# Patient Record
Sex: Female | Born: 1970
Health system: Southern US, Community
[De-identification: ages and names within clinical notes are randomized; demographics above are authoritative.]

## PROBLEM LIST (undated history)

## (undated) DIAGNOSIS — F32A Depression, unspecified: Secondary | ICD-10-CM

## (undated) DIAGNOSIS — G43909 Migraine, unspecified, not intractable, without status migrainosus: Secondary | ICD-10-CM

## (undated) DIAGNOSIS — F1010 Alcohol abuse, uncomplicated: Secondary | ICD-10-CM

## (undated) DIAGNOSIS — D649 Anemia, unspecified: Secondary | ICD-10-CM

## (undated) DIAGNOSIS — R7303 Prediabetes: Secondary | ICD-10-CM

## (undated) HISTORY — DX: Anemia, unspecified: D64.9

## (undated) HISTORY — PX: ABDOMINAL HYSTERECTOMY: SHX81

## (undated) HISTORY — DX: Alcohol abuse, uncomplicated: F10.10

## (undated) HISTORY — PX: TEAR DUCT PROBING: SHX793

## (undated) HISTORY — PX: PARTIAL HYSTERECTOMY: SHX80

## (undated) HISTORY — PX: FINGER SURGERY: SHX640

## (undated) HISTORY — DX: Prediabetes: R73.03

## (undated) HISTORY — DX: Depression, unspecified: F32.A

## (undated) HISTORY — DX: Migraine, unspecified, not intractable, without status migrainosus: G43.909

## (undated) HISTORY — PX: PILONIDAL CYST EXCISION: SHX744

## (undated) HISTORY — PX: KNEE SURGERY: SHX244

---
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
## 1999-11-22 ENCOUNTER — Ambulatory Visit (HOSPITAL_COMMUNITY): Admission: RE | Admit: 1999-11-22 | Discharge: 1999-11-22 | Payer: Self-pay | Admitting: *Deleted

## 1999-11-22 ENCOUNTER — Encounter: Payer: Self-pay | Admitting: *Deleted

## 1999-12-18 ENCOUNTER — Other Ambulatory Visit: Admission: RE | Admit: 1999-12-18 | Discharge: 1999-12-18 | Payer: Self-pay | Admitting: Obstetrics and Gynecology

## 2000-03-03 HISTORY — PX: FINGER SURGERY: SHX640

## 2000-03-22 ENCOUNTER — Emergency Department (HOSPITAL_COMMUNITY): Admission: EM | Admit: 2000-03-22 | Discharge: 2000-03-22 | Payer: Self-pay

## 2000-05-26 ENCOUNTER — Inpatient Hospital Stay (HOSPITAL_COMMUNITY): Admission: AD | Admit: 2000-05-26 | Discharge: 2000-05-26 | Payer: Self-pay | Admitting: Obstetrics and Gynecology

## 2000-06-16 ENCOUNTER — Inpatient Hospital Stay (HOSPITAL_COMMUNITY): Admission: AD | Admit: 2000-06-16 | Discharge: 2000-06-16 | Payer: Self-pay | Admitting: Obstetrics and Gynecology

## 2000-06-24 ENCOUNTER — Inpatient Hospital Stay (HOSPITAL_COMMUNITY): Admission: AD | Admit: 2000-06-24 | Discharge: 2000-06-24 | Payer: Self-pay | Admitting: Obstetrics & Gynecology

## 2000-06-28 ENCOUNTER — Inpatient Hospital Stay (HOSPITAL_COMMUNITY): Admission: AD | Admit: 2000-06-28 | Discharge: 2000-06-30 | Payer: Self-pay | Admitting: Obstetrics and Gynecology

## 2000-07-01 ENCOUNTER — Encounter: Admission: RE | Admit: 2000-07-01 | Discharge: 2000-07-13 | Payer: Self-pay | Admitting: Obstetrics and Gynecology

## 2000-07-30 ENCOUNTER — Other Ambulatory Visit: Admission: RE | Admit: 2000-07-30 | Discharge: 2000-07-30 | Payer: Self-pay | Admitting: Obstetrics and Gynecology

## 2001-08-03 ENCOUNTER — Other Ambulatory Visit: Admission: RE | Admit: 2001-08-03 | Discharge: 2001-08-03 | Payer: Self-pay | Admitting: Obstetrics and Gynecology

## 2004-06-26 ENCOUNTER — Other Ambulatory Visit: Admission: RE | Admit: 2004-06-26 | Discharge: 2004-06-26 | Payer: Self-pay | Admitting: Obstetrics and Gynecology

## 2005-10-07 ENCOUNTER — Encounter: Admission: RE | Admit: 2005-10-07 | Discharge: 2005-10-07 | Payer: Self-pay | Admitting: Obstetrics and Gynecology

## 2005-10-15 ENCOUNTER — Encounter: Admission: RE | Admit: 2005-10-15 | Discharge: 2005-10-15 | Payer: Self-pay | Admitting: Obstetrics and Gynecology

## 2006-04-14 ENCOUNTER — Ambulatory Visit (HOSPITAL_COMMUNITY): Admission: RE | Admit: 2006-04-14 | Discharge: 2006-04-14 | Payer: Self-pay | Admitting: Obstetrics and Gynecology

## 2008-05-05 ENCOUNTER — Encounter: Admission: RE | Admit: 2008-05-05 | Discharge: 2008-05-05 | Payer: Self-pay | Admitting: Family Medicine

## 2008-06-06 ENCOUNTER — Other Ambulatory Visit: Admission: RE | Admit: 2008-06-06 | Discharge: 2008-06-06 | Payer: Self-pay | Admitting: Interventional Radiology

## 2008-06-06 ENCOUNTER — Encounter (INDEPENDENT_AMBULATORY_CARE_PROVIDER_SITE_OTHER): Payer: Self-pay | Admitting: Interventional Radiology

## 2008-06-06 ENCOUNTER — Encounter: Admission: RE | Admit: 2008-06-06 | Discharge: 2008-06-06 | Payer: Self-pay | Admitting: Family Medicine

## 2008-06-29 ENCOUNTER — Encounter (HOSPITAL_COMMUNITY): Admission: RE | Admit: 2008-06-29 | Discharge: 2008-09-27 | Payer: Self-pay | Admitting: Internal Medicine

## 2008-09-09 ENCOUNTER — Ambulatory Visit (HOSPITAL_COMMUNITY): Admission: RE | Admit: 2008-09-09 | Discharge: 2008-09-09 | Payer: Self-pay | Admitting: Family Medicine

## 2010-03-24 ENCOUNTER — Encounter: Payer: Self-pay | Admitting: Obstetrics and Gynecology

## 2010-03-24 ENCOUNTER — Encounter: Payer: Self-pay | Admitting: Family Medicine

## 2010-03-25 ENCOUNTER — Encounter: Payer: Self-pay | Admitting: Internal Medicine

## 2010-11-28 ENCOUNTER — Other Ambulatory Visit: Payer: Self-pay | Admitting: Dermatology

## 2011-08-25 ENCOUNTER — Other Ambulatory Visit: Payer: Self-pay | Admitting: Obstetrics and Gynecology

## 2011-08-25 DIAGNOSIS — Z1231 Encounter for screening mammogram for malignant neoplasm of breast: Secondary | ICD-10-CM

## 2011-09-17 ENCOUNTER — Ambulatory Visit
Admission: RE | Admit: 2011-09-17 | Discharge: 2011-09-17 | Disposition: A | Discharge status: Home / Self Care | Payer: BC Managed Care – PPO | Source: Ambulatory Visit | Attending: Obstetrics and Gynecology | Admitting: Obstetrics and Gynecology

## 2011-09-17 DIAGNOSIS — Z1231 Encounter for screening mammogram for malignant neoplasm of breast: Secondary | ICD-10-CM

## 2011-09-18 ENCOUNTER — Other Ambulatory Visit: Payer: Self-pay | Admitting: Obstetrics and Gynecology

## 2011-09-18 DIAGNOSIS — N6452 Nipple discharge: Secondary | ICD-10-CM

## 2011-09-30 ENCOUNTER — Ambulatory Visit
Admission: RE | Admit: 2011-09-30 | Discharge: 2011-09-30 | Disposition: A | Discharge status: Home / Self Care | Payer: BC Managed Care – PPO | Source: Ambulatory Visit | Attending: Obstetrics and Gynecology | Admitting: Obstetrics and Gynecology

## 2011-09-30 DIAGNOSIS — N6452 Nipple discharge: Secondary | ICD-10-CM

## 2013-04-20 ENCOUNTER — Other Ambulatory Visit: Payer: Self-pay | Admitting: Family Medicine

## 2013-04-20 DIAGNOSIS — R519 Headache, unspecified: Secondary | ICD-10-CM

## 2013-04-20 DIAGNOSIS — R51 Headache: Principal | ICD-10-CM

## 2013-04-22 ENCOUNTER — Ambulatory Visit
Admission: RE | Admit: 2013-04-22 | Discharge: 2013-04-22 | Disposition: A | Discharge status: Home / Self Care | Payer: BC Managed Care – PPO | Source: Ambulatory Visit | Attending: Family Medicine | Admitting: Family Medicine

## 2013-04-22 DIAGNOSIS — R51 Headache: Principal | ICD-10-CM

## 2013-04-22 DIAGNOSIS — R519 Headache, unspecified: Secondary | ICD-10-CM

## 2013-04-24 ENCOUNTER — Other Ambulatory Visit: Payer: BC Managed Care – PPO

## 2013-09-16 ENCOUNTER — Other Ambulatory Visit: Payer: Self-pay | Admitting: Family Medicine

## 2013-09-16 DIAGNOSIS — J328 Other chronic sinusitis: Secondary | ICD-10-CM

## 2013-09-22 ENCOUNTER — Other Ambulatory Visit: Payer: BC Managed Care – PPO

## 2013-09-23 ENCOUNTER — Ambulatory Visit
Admission: RE | Admit: 2013-09-23 | Discharge: 2013-09-23 | Disposition: A | Discharge status: Home / Self Care | Payer: BC Managed Care – PPO | Source: Ambulatory Visit | Attending: Family Medicine | Admitting: Family Medicine

## 2013-09-23 DIAGNOSIS — J328 Other chronic sinusitis: Secondary | ICD-10-CM

## 2013-11-03 ENCOUNTER — Other Ambulatory Visit: Payer: Self-pay | Admitting: Neurology

## 2013-11-03 DIAGNOSIS — G4453 Primary thunderclap headache: Secondary | ICD-10-CM

## 2013-11-11 ENCOUNTER — Ambulatory Visit
Admission: RE | Admit: 2013-11-11 | Discharge: 2013-11-11 | Disposition: A | Discharge status: Home / Self Care | Payer: BC Managed Care – PPO | Source: Ambulatory Visit | Attending: Neurology | Admitting: Neurology

## 2013-11-11 DIAGNOSIS — G4453 Primary thunderclap headache: Secondary | ICD-10-CM

## 2013-11-11 MED ORDER — GADOBENATE DIMEGLUMINE 529 MG/ML IV SOLN
13.0000 mL | Freq: Once | INTRAVENOUS | Status: AC | PRN
  Administered 2013-11-11: 13 mL via INTRAVENOUS

## 2013-12-01 ENCOUNTER — Other Ambulatory Visit: Payer: Self-pay | Admitting: Obstetrics and Gynecology

## 2014-08-25 ENCOUNTER — Other Ambulatory Visit: Payer: Self-pay | Admitting: Obstetrics and Gynecology

## 2014-08-28 LAB — CYTOLOGY - PAP

## 2015-10-08 ENCOUNTER — Encounter: Payer: Self-pay | Admitting: Neurology

## 2015-10-08 ENCOUNTER — Ambulatory Visit (INDEPENDENT_AMBULATORY_CARE_PROVIDER_SITE_OTHER): Payer: BLUE CROSS/BLUE SHIELD | Admitting: Neurology

## 2015-10-08 VITALS — BP 110/62 | HR 90 | Ht 63.0 in | Wt 146.0 lb

## 2015-10-08 DIAGNOSIS — G43009 Migraine without aura, not intractable, without status migrainosus: Secondary | ICD-10-CM | POA: Diagnosis not present

## 2015-10-08 DIAGNOSIS — M5417 Radiculopathy, lumbosacral region: Secondary | ICD-10-CM

## 2015-10-08 DIAGNOSIS — R200 Anesthesia of skin: Secondary | ICD-10-CM

## 2015-10-08 DIAGNOSIS — R208 Other disturbances of skin sensation: Secondary | ICD-10-CM | POA: Diagnosis not present

## 2015-10-08 MED ORDER — SUMATRIPTAN SUCCINATE 100 MG PO TABS: ORAL_TABLET | ORAL | 2 refills | Status: DC

## 2015-10-08 MED ORDER — VERAPAMIL HCL ER 120 MG PO CP24: 240.0000 mg | ORAL_CAPSULE | Freq: Every day | ORAL | 2 refills | Status: DC

## 2015-10-08 NOTE — Patient Instructions (Signed)
Migraine Recommendations: 1.  Continue verapamil 240mg  daily.  Must get EKG checked at your PCP office. 2.  Stop meloxicam.  Take sumatriptan 100mg  at earliest onset of headache.  May repeat dose once in 2 hours if needed.  Do not exceed two tablets in 24 hours. 3.  Limit use of pain relievers to no more than 2 days out of the week.  These medications include acetaminophen, ibuprofen, triptans and narcotics.  This will help reduce risk of rebound headaches. 4.  Be aware of common food triggers such as processed sweets, processed foods with nitrites (such as deli meat, hot dogs, sausages), foods with MSG, alcohol (such as wine), chocolate, certain cheeses, certain fruits (dried fruits, some citrus fruit), vinegar, diet soda. 4.  Avoid caffeine 5.  Routine exercise 6.  Proper sleep hygiene 7.  Stay adequately hydrated with water 8.  Keep a headache diary. 9.  Maintain proper stress management. 10.  Do not skip meals. 11.  Consider supplements:  Magnesium oxide 400mg  to 600mg  daily, riboflavin 400mg , Coenzyme Q 10 100mg  three times daily 12.  Will set up nerve study of both arms to assess for carpal tunnel syndrome 13.  Will refer you to physical therapy for back pain  14.  Follow up in 3 months.

## 2015-10-08 NOTE — Progress Notes (Signed)
NEUROLOGY CONSULTATION NOTE  Karen Davis MRN: [id number] DOB: [birth year]  Referring provider: Dr. Justin Mend Primary care provider: Dr. Mechele Collin  Reason for consult:  migraine  HISTORY OF PRESENT ILLNESS: Karen Davis is a 45 year old right-handed woman with iron deficiency anemia, depression and multinodular goiter who presents for migraines.  History obtained by patient and PCP note.  Onset:  About 3 or 4 years ago Location:  Right maxillary/periorbital region, sometimes radiating to occipital region Quality:  Pressure, progressing to stabbing Intensity:  10/10 Aura:  no Prodrome:  no Associated symptoms:  Nausea, photophobia Duration:  30 minutes with sumatriptan, otherwise several hours Frequency:  6 days per month Triggers/exacerbating factors:  Change in barometric pressure, heat Relieving factors:  sumatriptan Activity:  Needs to lay down  Past NSAIDS:  flurbiprofen, oxaprozin, ibuprofen, ketoprofen, etodolac Past analgesics:  Excedrin, Tylenol Past abortive triptans:  no Past muscle relaxants:  no Past anti-emetic:  no Past anti-anxiolytic:  no Past sleep aide:  no Past antihypertensive medications:  no Past antidepressant medications:  no Past anticonvulsant medications:  Topiramate (effective but caused diarrhea) Past vitamins/Herbal/Supplements:  riboflavin Past antihistamines/decongestants:  no  Current NSAIDS:  First line:  Mobic 7.5mg  to 15mg  (ineffective) Current analgesics:  no Current triptans:  Second line:  sumatriptan 100mg   Current anti-emetic:  no Current muscle relaxants:  no Current anti-anxiolytic:  no Current sleep aide:  no Current Antihypertensive medications:  verapamil HCL ER 120mg  Current Antidepressant medications:  no Current Anticonvulsant medications:  no Current Vitamins/Herbal/Supplements:  Centrum, iron Current Antihistamines/Decongestants:  no Other therapy:  no  Caffeine:  Coffee, tea, soda Alcohol:  no Smoker:   no Diet:  Needs to increase hydration Exercise:  Not routine Depression/stress:  controlled Sleep hygiene:  poor Family history of headache:  Mother had migraines.  Brother had stroke in his 27s.  Paternal great grandfather had stroke in his 90s.  MRI and MRA of head and MRA of neck from 11/12/13 were personally reviewed and were unremarkable.  She also reports bilateral hand numbness and tingling that can radiate up to above elbows.  They are prominent when in bed and often wakes her up.  She denies neck pain or weakness.  She also has chronic low back pain since slipping and falling on her coccyx about 2 years ago.  She feels right lower back pain radiating down into her buttocks and associated with numbness and tingling of her anterior thigh.  There is no weakness.  PAST MEDICAL HISTORY: Past Medical History:  Diagnosis Date  . Anemia   . Migraines     PAST SURGICAL HISTORY: Past Surgical History:  Procedure Laterality Date  . FINGER SURGERY Right    pinkie   . KNEE SURGERY Right    x2  . PILONIDAL CYST EXCISION    . TEAR DUCT PROBING Left     MEDICATIONS: No current outpatient prescriptions on file prior to visit.   No current facility-administered medications on file prior to visit.     ALLERGIES: Allergies  Allergen Reactions  . Vantin [Cefpodoxime]     FAMILY HISTORY: Family History  Problem Relation Age of Onset  . Melanoma Mother   . Liver disease Father   . Stroke Brother     SOCIAL HISTORY: Social History   Social History  . Marital status: Married    Spouse name: N/A  . Number of children: N/A  . Years of education: N/A   Occupational History  . Not on file.  Social History Main Topics  . Smoking status: Former Smoker    Quit date: 10/08/2003  . Smokeless tobacco: Not on file  . Alcohol use No  . Drug use: No  . Sexual activity: Not on file   Other Topics Concern  . Not on file   Social History Narrative  . No narrative on file     REVIEW OF SYSTEMS: Constitutional: No fevers, chills, or sweats, no generalized fatigue, change in appetite Eyes: No visual changes, double vision, eye pain Ear, nose and throat: No hearing loss, ear pain, nasal congestion, sore throat Cardiovascular: No chest pain, palpitations Respiratory:  No shortness of breath at rest or with exertion, wheezes GastrointestinaI: No nausea, vomiting, diarrhea, abdominal pain, fecal incontinence Genitourinary:  No dysuria, urinary retention or frequency Musculoskeletal:  No neck pain, back pain Integumentary: No rash, pruritus, skin lesions Neurological: as above Psychiatric: No depression, insomnia, anxiety Endocrine: No palpitations, fatigue, diaphoresis, mood swings, change in appetite, change in weight, increased thirst Hematologic/Lymphatic:  No purpura, petechiae. Allergic/Immunologic: no itchy/runny eyes, nasal congestion, recent allergic reactions, rashes  PHYSICAL EXAM: Vitals:   10/08/15 1254  BP: 110/62  Pulse: 90   General: No acute distress.  Patient appears well-groomed.  Head:  Normocephalic/atraumatic Eyes:  fundi examined but not visualized Neck: supple, no paraspinal tenderness, full range of motion Back: No paraspinal tenderness Heart: regular rate and rhythm Lungs: Clear to auscultation bilaterally. Vascular: No carotid bruits. Neurological Exam: Mental status: alert and oriented to person, place, and time, recent and remote memory intact, fund of knowledge intact, attention and concentration intact, speech fluent and not dysarthric, language intact. Cranial nerves: CN I: not tested CN II: pupils equal, round and reactive to light, visual fields intact CN III, IV, VI:  full range of motion, no nystagmus, no ptosis CN V: facial sensation intact CN VII: upper and lower face symmetric CN VIII: hearing intact CN IX, X: gag intact, uvula midline CN XI: sternocleidomastoid and trapezius muscles intact CN XII: tongue  midline Bulk & Tone: normal, no fasciculations. Motor:  5/5 throughout  Sensation: temperature and vibration sensation intact. Deep Tendon Reflexes:  2+ throughout, toes downgoing.  Finger to nose testing:  Without dysmetria.  Heel to shin:  Without dysmetria.  Gait:  Normal station and stride.  Able to turn and tandem walk. Romberg negative.  IMPRESSION: Migraine without aura Bilateral hand numbness and tingling, suspect carpal tunnel syndrome Lumbosacral radiculopathy  PLAN: 1.  Continue verapamil HCL ER 240mg .  Will get EKG. 2.  Stop meloxicam since it is ineffective and just use sumatriptan as first line. 3.  NCV-EMG to assess for carpal tunnel syndrome 4.  PT for low back pain with radiculopathy. 5.  Follow up in 3 months.   Thank you for allowing me to take part in the care of this patient.  Metta Clines, DO  CC:  Dibas Mechele Collin, MD  Maurice Small, MD

## 2015-10-08 NOTE — Progress Notes (Signed)
Chart forwarded to Dr. Mechele Collin and Dr. Justin Mend

## 2015-10-18 ENCOUNTER — Encounter: Payer: Self-pay | Admitting: Physical Therapy

## 2015-10-18 ENCOUNTER — Ambulatory Visit (INDEPENDENT_AMBULATORY_CARE_PROVIDER_SITE_OTHER): Payer: BLUE CROSS/BLUE SHIELD | Admitting: Neurology

## 2015-10-18 ENCOUNTER — Ambulatory Visit: Payer: BLUE CROSS/BLUE SHIELD | Attending: Neurology | Admitting: Physical Therapy

## 2015-10-18 ENCOUNTER — Telehealth: Payer: Self-pay

## 2015-10-18 DIAGNOSIS — R200 Anesthesia of skin: Secondary | ICD-10-CM

## 2015-10-18 DIAGNOSIS — M6281 Muscle weakness (generalized): Secondary | ICD-10-CM

## 2015-10-18 DIAGNOSIS — R208 Other disturbances of skin sensation: Secondary | ICD-10-CM

## 2015-10-18 DIAGNOSIS — M545 Low back pain, unspecified: Secondary | ICD-10-CM

## 2015-10-18 DIAGNOSIS — R2689 Other abnormalities of gait and mobility: Secondary | ICD-10-CM

## 2015-10-18 NOTE — Procedures (Signed)
[facility]  [place], [place]  [place], [place] [address] Tel: [phone] Fax:  [phone] Test Date:  10/18/2015  Patient: Karen Davis DOB: Oct 22, 1970 Physician: Narda Amber, DO  Sex: Female Height: 5\' 3"  Ref Phys: Metta Clines  ID#: [id number] Temp: 33.2C Technician: Jerilynn Mages. Dean   Patient Complaints: This is a 45 year old female referred for evaluation of bilateral hand numbness and tingling.  NCV & EMG Findings: Extensive electrodiagnostic testing of the right upper extremity and additional studies of the left shows: 1. Bilateral median, ulnar, and palmar sensory responses are within normal limits.  2. Bilateral median and ulnar motor responses are within normal limits.  3. There is no evidence of active or chronic motor axon loss changes affecting any of the tested muscles. Motor unit configuration and recruitment pattern is within normal limits.   Impression: This is a normal study of the upper extremities. In particular, there is no evidence of carpal tunnel syndrome or a cervical radiculopathy.   ___________________________ Narda Amber, DO    Nerve Conduction Studies Anti Sensory Summary Table   Site NR Peak (ms) Norm Peak (ms) P-T Amp (V) Norm P-T Amp  Left Median Anti Sensory (2nd Digit)  Wrist    3.1 <3.4 38.5 >20  Right Median Anti Sensory (2nd Digit)  Wrist    3.3 <3.4 34.4 >20  Left Ulnar Anti Sensory (5th Digit)  Wrist    3.1 <3.1 42.1 >12  Right Ulnar Anti Sensory (5th Digit)  Wrist    2.9 <3.1 38.3 >12   Motor Summary Table   Site NR Onset (ms) Norm Onset (ms) O-P Amp (mV) Norm O-P Amp Site1 Site2 Delta-0 (ms) Dist (cm) Vel (m/s) Norm Vel (m/s)  Left Median Motor (Abd Poll Brev)  Wrist    2.7 <3.9 9.0 >6 Elbow Wrist 3.8 22.0 58 >50  Elbow    6.5  8.5         Right Median Motor (Abd Poll Brev)  Wrist    2.6 <3.9 9.5 >6 Elbow Wrist 4.1 24.0 59 >50  Elbow    6.7  9.3         Left Ulnar Motor (Abd Dig Minimi)  Wrist    2.3 <3.1  11.0 >7 B Elbow Wrist 2.9 17.0 59 >50  B Elbow    5.2  10.7  A Elbow B Elbow 1.3 10.0 77 >50  A Elbow    6.5  10.7         Right Ulnar Motor (Abd Dig Minimi)  Wrist    2.6 <3.1 9.7 >7 B Elbow Wrist 2.9 17.0 59 >50  B Elbow    5.5  9.7  A Elbow B Elbow 1.5 10.0 67 >50  A Elbow    7.0  9.3          Comparison Summary Table   Site NR Peak (ms) Norm Peak (ms) P-T Amp (V) Site1 Site2 Delta-P (ms) Norm Delta (ms)  Left Median/Ulnar Palm Comparison (Wrist - 8cm)  Median Palm    2.0 <2.2 56.2 Median Palm Ulnar Palm 0.0   Ulnar Palm    2.0 <2.2 24.3      Right Median/Ulnar Palm Comparison (Wrist - 8cm)  Median Palm    1.9 <2.2 68.0 Median Palm Ulnar Palm 0.1   Ulnar Palm    1.8 <2.2 21.7       EMG   Side Muscle Ins Act Fibs Psw Fasc Number Recrt Dur Dur. Amp Amp. Poly  Poly. Comment  Right 1stDorInt Nml Nml Nml Nml Nml Nml Nml Nml Nml Nml Nml Nml N/A  Right Ext Indicis Nml Nml Nml Nml Nml Nml Nml Nml Nml Nml Nml Nml N/A  Right PronatorTeres Nml Nml Nml Nml Nml Nml Nml Nml Nml Nml Nml Nml N/A  Right Biceps Nml Nml Nml Nml Nml Nml Nml Nml Nml Nml Nml Nml N/A  Right Triceps Nml Nml Nml Nml Nml Nml Nml Nml Nml Nml Nml Nml N/A  Right Deltoid Nml Nml Nml Nml Nml Nml Nml Nml Nml Nml Nml Nml N/A  Left 1stDorInt Nml Nml Nml Nml Nml Nml Nml Nml Nml Nml Nml Nml N/A  Left Ext Indicis Nml Nml Nml Nml Nml Nml Nml Nml Nml Nml Nml Nml N/A  Left PronatorTeres Nml Nml Nml Nml Nml Nml Nml Nml Nml Nml Nml Nml N/A  Left Biceps Nml Nml Nml Nml Nml Nml Nml Nml Nml Nml Nml Nml N/A  Left Triceps Nml Nml Nml Nml Nml Nml Nml Nml Nml Nml Nml Nml N/A  Left Deltoid Nml Nml Nml Nml Nml Nml Nml Nml Nml Nml Nml Nml N/A      Waveforms:

## 2015-10-18 NOTE — Therapy (Signed)
[facility] [address] [place], [place], [address] Phone: [phone]   Fax:  [phone]  Physical Therapy Evaluation  Patient Details  Name: Karen Davis MRN: [id number] Date of Birth: Nov 27, 1970 Referring Provider: Metta Clines, DO  Encounter Date: 10/18/2015      PT End of Session - 10/18/15 1211    Visit Number 1   Number of Visits 9  eval + 8 visits   Date for PT Re-Evaluation 11/17/15   Authorization Type BCBS   PT Start Time 1107   PT Stop Time 1152   PT Time Calculation (min) 45 min   Activity Tolerance Patient limited by pain   Behavior During Therapy [facility] for tasks assessed/performed      Past Medical History:  Diagnosis Date  . Anemia   . Migraines     Past Surgical History:  Procedure Laterality Date  . FINGER SURGERY Right    pinkie   . KNEE SURGERY Right    x2  . PILONIDAL CYST EXCISION    . TEAR DUCT PROBING Left     There were no vitals filed for this visit.       Subjective Assessment - 10/18/15 1112    Subjective R-sided back pain for "years and years" which shoots into R knee. Pt states, "I've always just always attributed it to sitting at a desk for a thousand years." Back and RLE pain exacerbated by sitting; relieved by standing. Transitional movements (sit <> stand; getting into/OOB) exacerbate pain. Pt is able to mitigate pain to 0/10 only when supine on back with knees completely straight.   Pertinent History PMH significant for: iron deficiency anemia, depression and multinodular goiter, pilonidal cyst excision (when pt was in highschool), R knee arthroscopy surgery x2, surgical excision of glass from R hand   Patient Stated Goals "To get rid of the pain in my back."   Currently in Pain? Yes   Pain Score 7    Pain Location Back   Pain Orientation Right;Lower   Pain Descriptors / Indicators Discomfort   Pain Type Chronic pain   Pain Radiating Towards no radiating pain  at this time; but at times, feels shooting, tingling into posterolateral R hip into distal anterior R thigh   Pain Onset More than a month ago   Pain Frequency Intermittent   Aggravating Factors  sitting, being sedentary, transitional movements   Pain Relieving Factors lying completely supine in bed; standing, movement   Effect of Pain on Daily Activities impacts ability to sit at desk   Multiple Pain Sites No            [facility] PT Assessment - 10/18/15 0001      Assessment   Medical Diagnosis Lumbosacral radiculopathy   Referring Provider Metta Clines, DO   Onset Date/Surgical Date 10/08/15  date of appt with referring MD     Precautions   Precautions None     Restrictions   Weight Bearing Restrictions No     Balance Screen   Has the patient fallen in the past 6 months No   Has the patient had a decrease in activity level because of a fear of falling?  No   Is the patient reluctant to leave their home because of a fear of falling?  No     Home Environment   Living Environment Private residence   Living Arrangements Spouse/significant other;Children   Type of [place] to enter  Entrance Stairs-Number of Steps 1 threshold into door followed by 8 steps into main-level   Entrance Stairs-Rails Left   Home Layout Multi-level   Alternate Level Stairs-Number of Steps 7   Alternate Level Stairs-Rails Left   Home Equipment None     Prior Function   Level of Independence Independent   Vocation Full time employment   Vocation Requirements FT job as maintenance and Horticulturist, commercial (desk job); on Retail banker for additional part-time job in [employer] son play lacrosse     Cognition   Overall Cognitive Status Within Functional Limits for tasks assessed     Sensation   Light Touch Appears Intact  BLE's   Proprioception Appears Intact  BLE's     ROM / Strength   AROM / PROM / Strength AROM     AROM   Overall AROM  Deficits    Overall AROM Comments Pain in R low back with active R/L hip flexion. Increased R LBP (7/10 to 8/10) with repeated thoracolumbar flexion; no change with repeated extension;  increased pain with R lateral flexion; no change with repeated L lateral flexion, repeated rotation to R/L. RLE pain not reproduced with repeated thoracolumbar movement.     PROM   Overall PROM  Unable to assess;Due to pain   Overall PROM Comments B knee, ankle PROM WFL; B hip flexion not formally assessed due to pain with AROM     Strength   Overall Strength Deficits;Due to pain   Overall Strength Comments B knee, ankle PROM WFL; B hip flexion not formally assessed due to pain with AROM  Suspect hip ABD weakness based on gait pattern.     Flexibility   Soft Tissue Assessment /Muscle Length yes   Quadriceps Increased concordant lower back pain with R Thomas Test     Palpation   Palpation comment Concordant pain at R PSIS.     Special Tests    Special Tests Lumbar;Sacrolliac Tests   Lumbar Tests Slump Test   Sacroiliac Tests  Sacral Compression   Leg length test  --     Slump test   Findings Negative   Side Right   Comment Did not reproduce concordant RLE pain.     Pelvic Dictraction   Findings Positive   Side  Right     Pelvic Compression   Findings Negative   Side Right     Sacral thrust    Findings Positive   Side Right     Gaenslen's test   Findings Positive   Side  Right     Sacral Compression   Findings Positive   Side  Right     other    Comments --  Supine to Long Sit Test suggests R anterior innominate      Bed Mobility   Bed Mobility Supine to Sit;Sit to Supine   Supine to Sit 5: Supervision   Supine to Sit Details (indicate cue type and reason) Cueing provided for logroll technique due to excessive movement of thoracic/lumbar spine, increased pain.   Sit to Supine 5: Supervision   Sit to Supine - Details (indicate cue type and reason) Cueing for logroll technique for increased  stability, decreased pain.     Transfers   Transfers Sit to Stand;Stand to Sit   Sit to Stand 7: Independent;5: Supervision   Stand to Sit 7: Independent   Stand to Sit Details Independent for stability/balance; however, cueing required for transverse abdominus activation during transitional movements  to mitigate pain; cueing also for pt awareness of postural alignment, foot placement (tends to stand with majority of WB on RLE)     Ambulation/Gait   Ambulation/Gait Yes   Ambulation/Gait Assistance 5: Supervision;7: Independent   Ambulation/Gait Assistance Details Independent for stability/balance; note excessive lateral hip movement during stance phase, bilaterally   Ambulation Distance (Feet) 100 Feet  x2   Assistive device None   Gait Pattern Step-through pattern;Lateral hip instability   Ambulation Surface Level;Indoor                   [place] Adult PT Treatment/Exercise - 10/18/15 0001      Exercises   Exercises Other Exercises   Other Exercises  Pt demonstrates significant difficulty activating tranverse abdominus muscle in supine, hook lying. Attempted MET for R anterior innominate (supine using dowel); however, pt with difficulty tolerating due to significant R PSIS pain "right when I stop the exercise," per pt.                PT Education - 10/18/15 1230    Education provided Yes   Education Details PT eval findings, goals, and POC. Explained SI joint, impact of body mechanics and core muscle strength/endurance on managing pain.    Person(s) Educated Patient   Methods Explanation;Demonstration   Comprehension Verbalized understanding          PT Short Term Goals - 10/18/15 1230      PT SHORT TERM GOAL #1   Title STG's = LTG's           PT Long Term Goals - 10/18/15 1231      PT LONG TERM GOAL #1   Title Pt will independently perform HEP to maximize functional gains made in PT.  (Target: 11/15/15)     PT LONG TERM GOAL #2   Title Oswestry  score will improve from 26% to </= 16% to indicate significant decrease in back pain-related disability. (11/15/15)     PT LONG TERM GOAL #3   Title Pt will verbalize understanding of 2 ways to mitigate pain to indicate effective self-management of symptoms.  (11/15/15)     PT LONG TERM GOAL #4   Title Pt will engage in >/= 30 consecutive minutes of exercise with no more than 2-point increase in back pain (on 10-point scale) to indicate improved activity tolerance.  (11/15/15)               Plan - 10/18/15 1213    Clinical Impression Statement Pt is a 45 y/o F referred to outpatient PT to address lower back pain with suspected radiculopathy. PMH significant for: iron deficiency anemia, depression and multinodular goiter, pilonidal cyst excision (when pt was in highschool), R knee arthroscopy surgery x2, surgical excision of glass from R hand.  PT evaluation reveals the following:  R PSIS pain which increases with transitional movements and decreases with lying flat on back with B knees straight; no radicular RLE pain reproduced on PT evaluation; (-) Slump Test, (+) Thigh Thrust, Gaenslen's, SIJ Distraction, and Sacral Compression Tests; Supine to Long Sit Test suggests R anterior innominate rotation; pt difficulty activating transverse abdominus muscle; and pt excessive thoracolumbar movement/hip instability with all functional mobility. Based on findings, unable to rule out R SI joint dysfunction. Pt will benefit from skilled outpatient PT 2x/week for 4 weeks ot address said impairments.    Rehab Potential Good   PT Frequency 2x / week   PT Duration 4 weeks   PT  Treatment/Interventions ADLs/Self Care Home Management;Cryotherapy;Biofeedback;Moist Heat;Gait training;Stair training;Functional mobility training;Therapeutic activities;Patient/family education;Neuromuscular re-education;Balance training;Therapeutic exercise;Manual techniques;Passive range of motion   PT Next Visit Plan 1) Pain  control. After pain better-controlled, 2) focus on core stabilization. Initiate HEP.   Consulted and Agree with Plan of Care Patient      Patient will benefit from skilled therapeutic intervention in order to improve the following deficits and impairments:  Abnormal gait, Improper body mechanics, Decreased strength, Hypermobility, Other (comment), Postural dysfunction, Pain (decreased endurance of core stabilization muscles)  Visit Diagnosis: Right-sided low back pain without sciatica - Plan: PT plan of care cert/re-cert  Other abnormalities of gait and mobility - Plan: PT plan of care cert/re-cert  Muscle weakness (generalized) - Plan: PT plan of care cert/re-cert     Problem List There are no active problems to display for this patient.   Billie Ruddy, PT, DPT [facility] [address] [place], [place], [address] Phone: [phone]   Fax:  [phone] 10/18/15, 12:40 PM  Name: TEOLA FELIPE MRN: [id number] Date of Birth: 07-04-1970

## 2015-10-18 NOTE — Telephone Encounter (Signed)
-----   Message from Pieter Partridge, DO sent at 10/18/2015  9:18 AM EDT ----- Nerve study is normal.  I don't have a definitive diagnosis for her hand and arm numbness but she can try wearing wrist splints at night to see if it helps with symptoms.

## 2015-10-19 NOTE — Telephone Encounter (Signed)
Pt aware. Will start wearing splints at night. Will call back if she has any worsening issues concerns.

## 2015-10-23 ENCOUNTER — Ambulatory Visit: Payer: BLUE CROSS/BLUE SHIELD | Admitting: Physical Therapy

## 2015-10-23 ENCOUNTER — Encounter: Payer: Self-pay | Admitting: Physical Therapy

## 2015-10-23 DIAGNOSIS — M6281 Muscle weakness (generalized): Secondary | ICD-10-CM

## 2015-10-23 DIAGNOSIS — M545 Low back pain, unspecified: Secondary | ICD-10-CM

## 2015-10-23 DIAGNOSIS — R2689 Other abnormalities of gait and mobility: Secondary | ICD-10-CM

## 2015-10-23 NOTE — Patient Instructions (Addendum)
Bridging    Slowly raise buttocks from floor, keeping stomach tight. Hold 5 sec's. Repeat _10_ times per set. Do _1_ sets per session. Do _1-2_ sessions per day.  http://orth.exer.us/1096   Copyright  VHI. All rights reserved.   Strengthening: Hip Abductor - Resisted    With band looped around both legs above knees, push thighs apart. Hold for 5 sec's.  Repeat _10___ times per set. Do _1_ sets per session. Do _1-2_ sessions per day.  http://orth.exer.us/688   Copyright  VHI. All rights reserved.    Clam Shell 45 Degrees    Lying with hips and knees bent 45, one pillow between knees and ankles. Lift knee. Be sure pelvis does not roll backward. Do not arch back. Hold for 5 sec's each rep. Do _10_ times, each leg, _1-2__ times per day.  http://ss.exer.us/75   Copyright  VHI. All rights reserved.

## 2015-10-24 NOTE — Therapy (Signed)
[facility] [address] [place], [place], [address] Phone: [phone]   Fax:  [phone]  Physical Therapy Treatment  Patient Details  Name: Karen Davis MRN: [id number] Date of Birth: 12/27/1970 Referring Provider: Metta Clines, DO  Encounter Date: 10/23/2015      PT End of Session - 10/23/15 1022    Visit Number 2   Number of Visits 9  eval + 8 visits   Date for PT Re-Evaluation 11/17/15   Authorization Type BCBS   PT Start Time 1018   PT Stop Time 1100   PT Time Calculation (min) 42 min   Activity Tolerance Patient tolerated treatment well;Patient limited by pain   Behavior During Therapy [facility] for tasks assessed/performed      Past Medical History:  Diagnosis Date  . Anemia   . Migraines     Past Surgical History:  Procedure Laterality Date  . FINGER SURGERY Right    pinkie   . KNEE SURGERY Right    x2  . PILONIDAL CYST EXCISION    . TEAR DUCT PROBING Left     There were no vitals filed for this visit.      Subjective Assessment - 10/23/15 1021    Subjective No new complaints.    Pertinent History PMH significant for: iron deficiency anemia, depression and multinodular goiter, pilonidal cyst excision (when pt was in highschool), R knee arthroscopy surgery x2, surgical excision of glass from R hand   Patient Stated Goals "To get rid of the pain in my back."   Currently in Pain? Yes   Pain Score 3    Pain Location Back   Pain Orientation Right;Lower   Pain Descriptors / Indicators Constant;Aching;Pressure   Pain Type Chronic pain   Pain Radiating Towards not as much or often in right leg   Pain Onset More than a month ago   Pain Frequency Intermittent   Aggravating Factors  sitting, transitional movements   Pain Relieving Factors lying down, standing, movement        10/23/15 1054  Manual Therapy  Manual Therapy Soft tissue mobilization;Myofascial release;Other (comment)  Manual  therapy comments muscle energy techniques performed to correct right anterior pelvic rotation. + long sit test before and - afterwards.                                   Soft tissue mobilization along right IT band, hip and trunk musculature for decreased pain/tightness  Myofascial Release along IT band and piriformis for decreased tightness.   Other Manual Therapy Foam roller used along IT band/hip/piriformis for decreased pain/tightness.      Issued/added the following to pt's HEP:  Bridging    Slowly raise buttocks from floor, keeping stomach tight. Hold 5 sec's. Repeat _10_ times per set. Do _1_ sets per session. Do _1-2_ sessions per day.  http://orth.exer.us/1096   Copyright  VHI. All rights reserved.   Strengthening: Hip Abductor - Resisted    With band looped around both legs above knees, push thighs apart. Hold for 5 sec's.  Repeat _10___ times per set. Do _1_ sets per session. Do _1-2_ sessions per day.  http://orth.exer.us/688   Copyright  VHI. All rights reserved.    Clam Shell 45 Degrees    Lying with hips and knees bent 45, one pillow between knees and ankles. Lift knee. Be sure pelvis does not roll backward. Do not  arch back. Hold for 5 sec's each rep. Do _10_ times, each leg, _1-2__ times per day.  http://ss.exer.us/75   Copyright  VHI. All rights reserved.          PT Education - 10/23/15 1047    Education provided Yes   Education Details HEP: for core and hip strengthening   Person(s) Educated Patient   Methods Explanation;Demonstration;Verbal cues;Handout   Comprehension Verbalized understanding;Returned demonstration;Verbal cues required;Need further instruction          PT Short Term Goals - 10/18/15 1230      PT SHORT TERM GOAL #1   Title STG's = LTG's           PT Long Term Goals - 10/18/15 1231      PT LONG TERM GOAL #1   Title Pt will independently perform HEP to maximize functional gains made in PT.  (Target:  11/15/15)     PT LONG TERM GOAL #2   Title Oswestry score will improve from 26% to </= 16% to indicate significant decrease in back pain-related disability. (11/15/15)     PT LONG TERM GOAL #3   Title Pt will verbalize understanding of 2 ways to mitigate pain to indicate effective self-management of symptoms.  (11/15/15)     PT LONG TERM GOAL #4   Title Pt will engage in >/= 30 consecutive minutes of exercise with no more than 2-point increase in back pain (on 10-point scale) to indicate improved activity tolerance.  (11/15/15)            Plan - 10/23/15 1023    Clinical Impression Statement Today's session addressed pain reduction and advancement of HEP as pt was able to tolerate. Pt did report decreased pain with manual therapy, however it increased again with exercises. Pt planning to use heat in car (has heated seats) and as needed at home. Slow progressing toward goals due to continued pain. Pt should benefit from continued PT to progress toward unmet goals.                                     Rehab Potential Good   PT Frequency 2x / week   PT Duration 4 weeks   PT Treatment/Interventions ADLs/Self Care Home Management;Cryotherapy;Biofeedback;Moist Heat;Gait training;Stair training;Functional mobility training;Therapeutic activities;Patient/family education;Neuromuscular re-education;Balance training;Therapeutic exercise;Manual techniques;Passive range of motion   PT Next Visit Plan 1) Pain control. After pain better-controlled, 2) focus on core stabilization. Initiate HEP.   Consulted and Agree with Plan of Care Patient      Patient will benefit from skilled therapeutic intervention in order to improve the following deficits and impairments:  Abnormal gait, Improper body mechanics, Decreased strength, Hypermobility, Other (comment), Postural dysfunction, Pain (decreased endurance of core stabilization muscles)  Visit Diagnosis: Right-sided low back pain without sciatica  Other  abnormalities of gait and mobility  Muscle weakness (generalized)     Problem List There are no active problems to display for this patient.   Willow Ora, PTA, [facility] [address], [place], [place] [address] [phone] 10/24/15, 12:16 PM   Name: Karen Davis MRN: [id number] Date of Birth: [birth year]

## 2015-10-30 ENCOUNTER — Ambulatory Visit: Payer: BLUE CROSS/BLUE SHIELD | Admitting: Physical Therapy

## 2015-10-31 ENCOUNTER — Ambulatory Visit: Payer: BLUE CROSS/BLUE SHIELD | Admitting: Physical Therapy

## 2015-10-31 DIAGNOSIS — R2689 Other abnormalities of gait and mobility: Secondary | ICD-10-CM

## 2015-10-31 DIAGNOSIS — M6281 Muscle weakness (generalized): Secondary | ICD-10-CM

## 2015-10-31 DIAGNOSIS — M545 Low back pain, unspecified: Secondary | ICD-10-CM

## 2015-10-31 NOTE — Therapy (Signed)
[facility] [address] [place], [place], [address] Phone: [phone]   Fax:  [phone]  Physical Therapy Treatment  Patient Details  Name: Karen Davis MRN: [id number] Date of Birth: Dec 19, 1970 Referring Provider: Metta Clines, DO  Encounter Date: 10/31/2015      PT End of Session - 10/31/15 1423    Visit Number 3   Number of Visits 9  eval + 8 visits   Date for PT Re-Evaluation 11/17/15   Authorization Type BCBS   PT Start Time 0939   PT Stop Time 1010   PT Time Calculation (min) 31 min   Activity Tolerance Patient limited by pain   Behavior During Therapy [facility] for tasks assessed/performed      Past Medical History:  Diagnosis Date  . Anemia   . Migraines     Past Surgical History:  Procedure Laterality Date  . FINGER SURGERY Right    pinkie   . KNEE SURGERY Right    x2  . PILONIDAL CYST EXCISION    . TEAR DUCT PROBING Left     There were no vitals filed for this visit.      Subjective Assessment - 10/31/15 0940    Subjective Pt states no relief from pain after past several sessions.     Pertinent History PMH significant for: iron deficiency anemia, depression and multinodular goiter, pilonidal cyst excision (when pt was in highschool), R knee arthroscopy surgery x2, surgical excision of glass from R hand   Patient Stated Goals "To get rid of the pain in my back."   Currently in Pain? Yes   Pain Score 4   4 1/2   Pain Location Back   Pain Orientation Right;Lower   Pain Descriptors / Indicators Aching;Pressure;Constant   Pain Type Chronic pain   Pain Radiating Towards towards front of right leg   Pain Onset More than a month ago   Pain Frequency Intermittent   Aggravating Factors  sitting, transitional movements   Pain Relieving Factors lying down, standing, movement   Effect of Pain on Daily Activities impacts ability to sit at desk       Performed supine bridging x 10, pelvic  tilt x 15 with verbal and tactile cues for technique, LE trunk rotation x 10 sec x 3 each side, LE's on red therapy ball for extension<>flexion working on control of ball x 15 reps.  L sidelying for clams x 10 reps.  Trial of simulated SI belt with gait to see if compression would improve pain with gait and transfers.  Ambulated >400' no device with reports of some improvement during gait but continues to have discomfort with transitional movements (sit<>stand).  Gait 110' x 2 without compression.        PT Education - 10/31/15 1422    Education provided Yes   Education Details Purpose of SI belt and where to obtain if desired   Person(s) Educated Patient   Methods Explanation;Demonstration;Handout   Comprehension Verbalized understanding          PT Short Term Goals - 10/18/15 1230      PT SHORT TERM GOAL #1   Title STG's = LTG's           PT Long Term Goals - 10/18/15 1231      PT LONG TERM GOAL #1   Title Pt will independently perform HEP to maximize functional gains made in PT.  (Target: 11/15/15)     PT LONG TERM GOAL #  2   Title Oswestry score will improve from 26% to </= 16% to indicate significant decrease in back pain-related disability. (11/15/15)     PT LONG TERM GOAL #3   Title Pt will verbalize understanding of 2 ways to mitigate pain to indicate effective self-management of symptoms.  (11/15/15)     PT LONG TERM GOAL #4   Title Pt will engage in >/= 30 consecutive minutes of exercise with no more than 2-point increase in back pain (on 10-point scale) to indicate improved activity tolerance.  (11/15/15)               Plan - 10/31/15 1424    Clinical Impression Statement Pt reports little relief with pain from previous sessions.  Pt continues to have pain at end range of sit<>stand.  Reported some relief with SI simulated band.  Continue PT per POC.   Rehab Potential Good   PT Frequency 2x / week   PT Duration 4 weeks   PT Treatment/Interventions  ADLs/Self Care Home Management;Cryotherapy;Biofeedback;Moist Heat;Gait training;Stair training;Functional mobility training;Therapeutic activities;Patient/family education;Neuromuscular re-education;Balance training;Therapeutic exercise;Manual techniques;Passive range of motion   PT Next Visit Plan Follow up on if pt purchased SI belt.  1) Pain control. After pain better-controlled, 2) focus on core stabilization. Initiate HEP.   Consulted and Agree with Plan of Care Patient      Patient will benefit from skilled therapeutic intervention in order to improve the following deficits and impairments:  Abnormal gait, Improper body mechanics, Decreased strength, Hypermobility, Other (comment), Postural dysfunction, Pain (decreased endurance of core stabilization muscles)  Visit Diagnosis: Right-sided low back pain without sciatica  Other abnormalities of gait and mobility  Muscle weakness (generalized)     Problem List There are no active problems to display for this patient.   Narda Bonds 10/31/2015, 2:27 PM  [facility] [address] [place], [place], [address] Phone: [phone]   Fax:  [phone]  Name: Karen Davis MRN: [id number] Date of Birth: 09-30-1970   Narda Bonds, PTA [facility] 10/31/15 2:27 PM Phone: [phone] Fax: [phone]

## 2015-11-01 ENCOUNTER — Ambulatory Visit: Payer: BLUE CROSS/BLUE SHIELD | Admitting: Physical Therapy

## 2015-11-01 DIAGNOSIS — M545 Low back pain, unspecified: Secondary | ICD-10-CM

## 2015-11-01 DIAGNOSIS — R2689 Other abnormalities of gait and mobility: Secondary | ICD-10-CM

## 2015-11-01 DIAGNOSIS — M6281 Muscle weakness (generalized): Secondary | ICD-10-CM

## 2015-11-01 NOTE — Patient Instructions (Signed)
Abductor Strength: Bridge Pose (Strap)    Place RED theraband around both legs (as pictured) just above the knees. Press outward into strap with both knees. While holding knees outward, lift hips to bridge. Hold 1-2 seconds, then slowly lower. Perform 10 reps, 2-3 times per day.   To progress, increase by 2-3 reps, as tolerated, until able to perform 20 consecutive reps. (Let Blair/Kathy know when able to perform 20 reps easily with red band, so we can give you a harder band).   HIP: Hamstrings - Short Sitting    Rest LEFT leg on raised surface. Keep knee straight. Hinge forward at waist until gentle stretch is felt in back of left leg. Hold __60_ seconds, __3-4__ times per day on the LEFT leg only.     Abdominal Bracing With Pelvic Floor (Hook-Lying)    With neutral spine, tighten abdominals as we discussed (can feel by touching hip point bones then going inward 2 finger widths, downward 2 finger widths). While keeping this muscle engaged, elevated right foot from floor 4-6", slowly lower, then elevate left leg. March in this manner while racing abdominals 10 times. Progress by 2-3 reps at a time until able to perform 20 consecutive reps. Try to practice 2 times per day.  Copyright  VHI. All rights reserved.

## 2015-11-01 NOTE — Therapy (Signed)
[facility] [address] [place], [place], [address] Phone: [phone]   Fax:  [phone]  Physical Therapy Treatment  Patient Details  Name: Karen Davis MRN: [id number] Date of Birth: [birth year] Referring Provider: Metta Clines, DO  Encounter Date: 11/01/2015      PT End of Session - 11/01/15 0941    Visit Number 4   Number of Visits 9   Date for PT Re-Evaluation 11/17/15   Authorization Type BCBS   PT Start Time 0806   PT Stop Time 0850   PT Time Calculation (min) 44 min   Activity Tolerance Patient limited by pain   Behavior During Therapy [facility] for tasks assessed/performed      Past Medical History:  Diagnosis Date  . Anemia   . Migraines     Past Surgical History:  Procedure Laterality Date  . FINGER SURGERY Right    pinkie   . KNEE SURGERY Right    x2  . PILONIDAL CYST EXCISION    . TEAR DUCT PROBING Left     There were no vitals filed for this visit.      Subjective Assessment - 11/01/15 0808    Subjective Pt reports pain was best controlled when using SIJ belt during session yesterday but states, "I'd rather not have to use a belt all the time."   Pertinent History PMH significant for: iron deficiency anemia, depression and multinodular goiter, pilonidal cyst excision (when pt was in highschool), R knee arthroscopy surgery x2, surgical excision of glass from R hand   Patient Stated Goals "To get rid of the pain in my back."   Currently in Pain? Yes   Pain Score 3    Pain Location Back   Pain Orientation Right;Lower   Pain Descriptors / Indicators Dull;Pressure   Pain Type Chronic pain   Pain Radiating Towards not radiating into R leg today   Pain Onset More than a month ago   Pain Frequency Intermittent   Aggravating Factors  sitting, transitional movement   Pain Relieving Factors lying down, standing, movement   Effect of Pain on Daily Activities impacts ability to sit at desk    Multiple Pain Sites No            [facility] PT Assessment - 11/01/15 0001      Strength   Overall Strength Comments Further assessment of B hip strength reveals B hip ABD strength 4+/5 (despite lateral hip instability during gait).     Flexibility   Soft Tissue Assessment /Muscle Length yes   Hamstrings L hamstring extensibility limted and exacerbates concordant pain at R SIJ.                     [facility] Adult PT Treatment/Exercise - 11/01/15 0001      Exercises   Exercises Other Exercises   Other Exercises  Supine transverse abdominus (TA) activation with concurrent LE marching 2 x10 reps with tactile cueing;  sit <> stand from EOM with dowel held at back for cue to maintain TA activation and thoracolumbar stability during sit <> stand x10 reps, x5 reps with red Tband looped around B thighs for hp ABD activation (due to noted B genu valgum with sit <> stand). Modified clamshells to bridging with B hip ABD resisted by red Tband x10 reps. Seated on physioball, attempted LE marching with TA activation, however pt unable to stabilize physioball while marcihng due to core instability. Attempted hamstring strength seated EOM  with LLE on mat table in knee extension; however, this increased R SIJ pain (suspect this increased joint play at SIJ); therefore, modified to L hamstring stretch seated EOM with LLE on 6" step (not painful) 2 x60-sec holds. Attempted hpi flexor self-stretch in both prone and supine; note inrceased concordant R SIJ pain when pt stretching R iliopsoas but not when stretching rectus femoris.                PT Education - 11/01/15 0945    Education provided Yes   Education Details HEP modified; see Pt Instructions.    Person(s) Educated Patient   Methods Explanation;Demonstration;Handout   Comprehension Verbalized understanding;Returned demonstration          PT Short Term Goals - 10/18/15 1230      PT SHORT TERM GOAL #1   Title STG's = LTG's            PT Long Term Goals - 10/18/15 1231      PT LONG TERM GOAL #1   Title Pt will independently perform HEP to maximize functional gains made in PT.  (Target: 11/15/15)     PT LONG TERM GOAL #2   Title Oswestry score will improve from 26% to </= 16% to indicate significant decrease in back pain-related disability. (11/15/15)     PT LONG TERM GOAL #3   Title Pt will verbalize understanding of 2 ways to mitigate pain to indicate effective self-management of symptoms.  (11/15/15)     PT LONG TERM GOAL #4   Title Pt will engage in >/= 30 consecutive minutes of exercise with no more than 2-point increase in back pain (on 10-point scale) to indicate improved activity tolerance.  (11/15/15)               Plan - 11/01/15 0945    Clinical Impression Statement Pt reports pain was best controlled when using SIJ belt but pt does not wish to purchase an SIJ belt. Formal assessment of hip ABD strength reveals gluteus medius strength grossly WFL bilaterally; however, pt does not appear to activation gluteus medius functionally, as exhibited by lateral hip instability during gait and B genu valgum during sit <> stand.  Also note decreased hamstring extensibility on L, which likely further exacerbates joint play at R SIJ (suspct anterior innominate). Difficult to stretch R iliopsoas and L hamstrings without exacerbating pain during this session. Therefore, modified HEP to promote functional activation of B hip abductors during mobility, to increase L hamstring extensibility within pain-free parameters, and to initiate core stabilization.    Rehab Potential Good   PT Frequency 2x / week   PT Duration 4 weeks   PT Treatment/Interventions ADLs/Self Care Home Management;Cryotherapy;Biofeedback;Moist Heat;Gait training;Stair training;Functional mobility training;Therapeutic activities;Patient/family education;Neuromuscular re-education;Balance training;Therapeutic exercise;Manual techniques;Passive range  of motion   PT Next Visit Plan   1) Pain control. After pain better-controlled, 2) focus on core stabilization. Focus on mechanics (avoid genu valgum) during functional activities (sit <> stand, bridging) within pain-free parameters. Juliann Pulse - try your idea of using SIJ belt while stretching R iliopsoas, L hamstrings.   Consulted and Agree with Plan of Care Patient      Patient will benefit from skilled therapeutic intervention in order to improve the following deficits and impairments:  Abnormal gait, Improper body mechanics, Decreased strength, Hypermobility, Other (comment), Postural dysfunction, Pain (decreased endurance of core stabilization muscles)  Visit Diagnosis: Right-sided low back pain without sciatica  Other abnormalities of gait and mobility  Muscle weakness (  generalized)     Problem List There are no active problems to display for this patient.   Billie Ruddy, PT, DPT [facility] [address] [place], [place], [address] Phone: [phone]   Fax:  [phone] 11/01/15, 9:51 AM  Name: ASHMI AYENI MRN: [id number] Date of Birth: Feb 10, 1971

## 2015-11-07 ENCOUNTER — Ambulatory Visit: Payer: BLUE CROSS/BLUE SHIELD | Attending: Neurology | Admitting: Physical Therapy

## 2015-11-07 ENCOUNTER — Encounter: Payer: Self-pay | Admitting: Physical Therapy

## 2015-11-07 DIAGNOSIS — M545 Low back pain, unspecified: Secondary | ICD-10-CM

## 2015-11-07 DIAGNOSIS — R2689 Other abnormalities of gait and mobility: Secondary | ICD-10-CM | POA: Diagnosis present

## 2015-11-07 DIAGNOSIS — M6281 Muscle weakness (generalized): Secondary | ICD-10-CM | POA: Diagnosis present

## 2015-11-07 NOTE — Patient Instructions (Addendum)
Sacroiliac Joint Dysfunction Sacroiliac joint dysfunction is a condition that causes inflammation on one or both sides of the sacroiliac (SI) joint. The SI joint connects the lower part of the spine (sacrum) with the two upper portions of the pelvis (ilium). This condition causes deep aching or burning pain in the low back. In some cases, the pain may also spread into one or both buttocks or hips or spread down the legs. CAUSES This condition may be caused by:  Pregnancy. During pregnancy, extra stress is put on the SI joints because the pelvis widens.  Injury, such as:  Car accidents.  Sport-related injuries.  Work-related injuries.  Having one leg that is shorter than the other.  Conditions that affect the joints, such as:  Rheumatoid arthritis.  Gout.  Psoriatic arthritis.  Joint infection (septic arthritis). Sometimes, the cause of SI joint dysfunction is not known. SYMPTOMS Symptoms of this condition include:  Aching or burning pain in the lower back. The pain may also spread to other areas, such as:  Buttocks.  Groin.  Thighs and legs.  Muscle spasms in or around the painful areas.  Increased pain when standing, walking, running, stair climbing, bending, or lifting. DIAGNOSIS Your health care provider will do a physical exam and take your medical history. During the exam, the health care provider may move one or both of your legs to different positions to check for pain. Various tests may be done to help verify the diagnosis, including:  Imaging tests to look for other causes of pain. These may include:  MRI.  CT scan.  Bone scan.  Diagnostic injection. A numbing medicine is injected into the SI joint using a needle. If the pain is temporarily improved or stopped after the injection, this can indicate that SI joint dysfunction is the problem. TREATMENT Treatment may vary depending on the cause and severity of your condition. Treatment options may  include:  Applying ice or heat to the lower back area. This can help to reduce pain and muscle spasms.  Medicines to relieve pain or inflammation or to relax the muscles.  Wearing a back brace (sacroiliac brace) to help support the joint while your back is healing.  Physical therapy to increase muscle strength around the joint and flexibility at the joint. This may also involve learning proper body positions and ways of moving to relieve stress on the joint.  Direct manipulation of the SI joint.  Injections of steroid medicine into the joint in order to reduce pain and swelling.  Radiofrequency ablation to burn away nerves that are carrying pain messages from the joint.  Use of a device that provides electrical stimulation in order to reduce pain at the joint.  Surgery to put in screws and plates that limit or prevent joint motion. This is rare. HOME CARE INSTRUCTIONS  Rest as needed. Limit your activities as directed by your health care provider.  Take medicines only as directed by your health care provider.  If directed, apply ice to the affected area:  Put ice in a plastic bag.  Place a towel between your skin and the bag.  Leave the ice on for 20 minutes, 2-3 times per day.  Use a heating pad or a moist heat pack as directed by your health care provider.  Exercise as directed by your health care provider or physical therapist.  Keep all follow-up visits as directed by your health care provider. This is important. SEEK MEDICAL CARE IF:  Your pain is not controlled   with medicine.  You have a fever.  You have increasingly severe pain. SEEK IMMEDIATE MEDICAL CARE IF:  You have weakness, numbness, or tingling in your legs or feet.  You lose control of your bladder or bowel.   This information is not intended to replace advice given to you by your health care provider. Make sure you discuss any questions you have with your health care provider.   Document Released:  05/16/2008 Document Revised: 07/04/2014 Document Reviewed: 10/25/2013 Elsevier Interactive Patient Education 2016 [employer]  

## 2015-11-07 NOTE — Therapy (Signed)
[facility] [address] [place], [place], [address] Phone: [phone]   Fax:  [phone]  Physical Therapy Treatment  Patient Details  Name: Karen Davis MRN: [id number] Date of Birth: 1970/06/30 Referring Provider: Metta Clines, DO  Encounter Date: 11/07/2015      PT End of Session - 11/07/15 1024    Visit Number 5   Number of Visits 9   Date for PT Re-Evaluation 11/17/15   Authorization Type BCBS   PT Start Time 1020   PT Stop Time 1100   PT Time Calculation (min) 40 min   Activity Tolerance Patient tolerated treatment well;No increased pain   Behavior During Therapy WFL for tasks assessed/performed      Past Medical History:  Diagnosis Date  . Anemia   . Migraines     Past Surgical History:  Procedure Laterality Date  . FINGER SURGERY Right    pinkie   . KNEE SURGERY Right    x2  . PILONIDAL CYST EXCISION    . TEAR DUCT PROBING Left     There were no vitals filed for this visit.      Subjective Assessment - 11/07/15 1022    Subjective Reports has felt better since last visit.    Pertinent History PMH significant for: iron deficiency anemia, depression and multinodular goiter, pilonidal cyst excision (when pt was in highschool), R knee arthroscopy surgery x2, surgical excision of glass from R hand   Patient Stated Goals "To get rid of the pain in my back."   Currently in Pain? Yes   Pain Score 2    Pain Location Back   Pain Orientation Right;Lower   Pain Descriptors / Indicators Sore;Tender   Pain Type Chronic pain   Pain Frequency Intermittent   Aggravating Factors  sitting   Pain Relieving Factors lying down, standing, movement     Exercises: all exercises performed with simulated SIJ belt in place. Cues on form and technique with ex's provided as needed.  Sit<>stand's without UE suppport - With ball squeeze between knees x 10 reps to promote correct LE alignment with transfer -  Without ball squeeze's x 10 reps with emphasis on LE alignment to demo carryover.  (pt was able to tell a difference in how knee's should align to prevent valgus alignment with standing)    hook lying on mat - bridge, 5 sec holds x 10 reps - clam shells with red band, 5 sec holds x 10 reps - bridge with clam shells, 5 sec holds x 10 reps  Standing at counter top - side stepping left<>right with red band resistance to LE"s x 3 laps each way - forward walking mini lunges x 4 laps (no resistance) - forward/backward walking in squat position with LE'S kept in wide stance 2 laps each way - side stepping in squat position x 1 lap each way,limited laps due to increased knee pain  Seated on blue pball - bouncing x 1 minute with emphasis on tall posture - pelvic rocking fwd/bwd and laterally x 10 reps each way - pelvic circles x 10 each way - alternating marching x 10 reps each - alternating LAQ's x 10 each side - combo contralateral UE/leg raises x 10 each side         PT Short Term Goals - 10/18/15 1230      PT SHORT TERM GOAL #1   Title STG's = LTG's           PT  Long Term Goals - 10/18/15 1231      PT LONG TERM GOAL #1   Title Pt will independently perform HEP to maximize functional gains made in PT.  (Target: 11/15/15)     PT LONG TERM GOAL #2   Title Oswestry score will improve from 26% to </= 16% to indicate significant decrease in back pain-related disability. (11/15/15)     PT LONG TERM GOAL #3   Title Pt will verbalize understanding of 2 ways to mitigate pain to indicate effective self-management of symptoms.  (11/15/15)     PT LONG TERM GOAL #4   Title Pt will engage in >/= 30 consecutive minutes of exercise with no more than 2-point increase in back pain (on 10-point scale) to indicate improved activity tolerance.  (11/15/15)           Plan - 11/07/15 1024    Clinical Impression Statement Today's skilled session focues on hip and core strenthening concurrent  with wearing simulated SIJ belt for increased stability. Pt did not report any increased SIJ pain, only in left knee pain with any flexion activity performed that resolved with rest. Pt is making slow, steady progress toward goals.    Rehab Potential Good   PT Frequency 2x / week   PT Duration 4 weeks   PT Treatment/Interventions ADLs/Self Care Home Management;Cryotherapy;Biofeedback;Moist Heat;Gait training;Stair training;Functional mobility training;Therapeutic activities;Patient/family education;Neuromuscular re-education;Balance training;Therapeutic exercise;Manual techniques;Passive range of motion   PT Next Visit Plan   1) Pain control. After pain better-controlled, 2) focus on core stabilization. Focus on mechanics (avoid genu valgum) during functional activities (sit <> stand, bridging) within pain-free parameters. continue to use SIJ belt with exericses if that helped with today's session.                  Consulted and Agree with Plan of Care Patient      Patient will benefit from skilled therapeutic intervention in order to improve the following deficits and impairments:  Abnormal gait, Improper body mechanics, Decreased strength, Hypermobility, Other (comment), Postural dysfunction, Pain (decreased endurance of core stabilization muscles)  Visit Diagnosis: Right-sided low back pain without sciatica  Other abnormalities of gait and mobility  Muscle weakness (generalized)     Problem List There are no active problems to display for this patient.   Willow Ora, PTA, [facility] [address] [place], [place] [address] [phone] 11/08/15, 10:34 AM   Name: Karen Davis MRN: [id number] Date of Birth: April 10, 1970

## 2015-11-09 ENCOUNTER — Encounter: Payer: Self-pay | Admitting: Physical Therapy

## 2015-11-09 ENCOUNTER — Ambulatory Visit: Payer: BLUE CROSS/BLUE SHIELD | Admitting: Physical Therapy

## 2015-11-09 DIAGNOSIS — M545 Low back pain, unspecified: Secondary | ICD-10-CM

## 2015-11-09 DIAGNOSIS — M6281 Muscle weakness (generalized): Secondary | ICD-10-CM

## 2015-11-09 NOTE — Therapy (Signed)
[facility] [address] [place], [place], [address] Phone: [phone]   Fax:  [phone]  Physical Therapy Treatment  Patient Details  Name: Karen Davis MRN: [id number] Date of Birth: 1970-08-25 Referring Provider: Metta Clines, DO  Encounter Date: 11/09/2015      PT End of Session - 11/09/15 1624    Visit Number 6   Number of Visits 9   Date for PT Re-Evaluation 11/17/15   Authorization Type BCBS   PT Start Time 0800   PT Stop Time 0845   PT Time Calculation (min) 45 min   Activity Tolerance Patient tolerated treatment well;No increased pain   Behavior During Therapy WFL for tasks assessed/performed      Past Medical History:  Diagnosis Date  . Anemia   . Migraines     Past Surgical History:  Procedure Laterality Date  . FINGER SURGERY Right    pinkie   . KNEE SURGERY Right    x2  . PILONIDAL CYST EXCISION    . TEAR DUCT PROBING Left     There were no vitals filed for this visit.      Subjective Assessment - 11/09/15 0804    Subjective Reports she has felt better since last visit although continues to have pain in the R low back.  Pt states that unloading the R low back and shifting weight to the L LE helps decr pain.   Pertinent History PMH significant for: iron deficiency anemia, depression and multinodular goiter, pilonidal cyst excision (when pt was in highschool), R knee arthroscopy surgery x2, surgical excision of glass from R hand   Patient Stated Goals "To get rid of the pain in my back."   Currently in Pain? Yes   Pain Score 2    Pain Location Back   Pain Orientation Right;Lower   Pain Descriptors / Indicators Sore;Tender   Pain Type Chronic pain   Pain Onset More than a month ago   Pain Frequency Intermittent   Aggravating Factors  positional change   Pain Relieving Factors weight shifting to L LE   Multiple Pain Sites No                         [facility] Adult PT  Treatment/Exercise - 11/09/15 0001      Exercises   Exercises --     Manual Therapy   Manual Therapy Soft tissue mobilization;Myofascial release;Joint mobilization   Manual therapy comments Manual therapy to B paraspinals to decr pain and in cr functional mobility.  SPT noted significant decr of tone in B paraspinals with pt reporting decr in pain afterwards.   Joint Mobilization Gr 3 PA mobs to L3-5 to incr mobility and decr pain in lumbar region of the back.  Noted hypomobility in region with point tenderness to area that was improved after manual therapy.                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  Pt also educated on importance of strengthening the TA to decr pain and incr functional mobility.   Person(s) Educated Patient   Methods Explanation;Demonstration;Tactile cues   Comprehension Verbalized understanding;Returned demonstration          PT Short Term Goals - 10/18/15 1230      PT SHORT TERM GOAL #1  Title STG's = LTG's           PT Long Term Goals - 10/18/15 1231      PT LONG TERM GOAL #1   Title Pt will independently perform HEP to maximize functional gains made in PT.  (Target: 11/15/15)     PT LONG TERM GOAL #2   Title Oswestry score will improve from 26% to </= 16% to indicate significant decrease in back pain-related disability. (11/15/15)     PT LONG TERM GOAL #3   Title Pt will verbalize understanding of 2 ways to mitigate pain to indicate effective self-management of symptoms.  (11/15/15)     PT LONG TERM GOAL #4   Title Pt will engage in >/= 30 consecutive minutes of exercise with no more than 2-point increase in back pain (on 10-point scale) to indicate improved activity tolerance.  (11/15/15)               Plan - 11/09/15 1625    Clinical Impression Statement Pt responded to manual therapy with self  reported decr in pain with simulated work movement and no report of radiating pain down the R LE.  She states that she has not experienced much pain difference since the beginning of therapy although does experience some pain relief within the sessions.  Pt continues to demonstrate decr core strength and stability with gross strength deficits throughout.   She would continue to benefit from skilled PT to address these deficits.   Rehab Potential Good   PT Frequency 2x / week   PT Duration 4 weeks   PT Treatment/Interventions ADLs/Self Care Home Management;Cryotherapy;Biofeedback;Moist Heat;Gait training;Stair training;Functional mobility training;Therapeutic activities;Patient/family education;Neuromuscular re-education;Balance training;Therapeutic exercise;Manual techniques;Passive range of motion   PT Next Visit Plan   1) Pain control. After pain better-controlled, 2) focus on core stabilization. Focus on mechanics (avoid genu valgum) during functional activities (sit <> stand, bridging) within pain-free parameters. continue to use SIJ belt with exericses if that helped with today's session.                  PT Home Exercise Plan core stabilization exercises   Consulted and Agree with Plan of Care Patient      Patient will benefit from skilled therapeutic intervention in order to improve the following deficits and impairments:  Abnormal gait, Improper body mechanics, Decreased strength, Hypermobility, Other (comment), Postural dysfunction, Pain (decreased endurance of core stabilization muscles)  Visit Diagnosis: Right-sided low back pain without sciatica  Muscle weakness (generalized)     Problem List There are no active problems to display for this patient.   Katherine Mantle, SPT 11/09/2015, 4:35 PM  [facility] [address] [place], [place], [address] Phone: [phone]   Fax:  [phone]  Name: Karen Davis MRN: [id number] Date of Birth: 1970-03-28

## 2015-11-13 ENCOUNTER — Ambulatory Visit: Payer: BLUE CROSS/BLUE SHIELD | Admitting: Physical Therapy

## 2015-11-13 ENCOUNTER — Encounter: Payer: Self-pay | Admitting: Physical Therapy

## 2015-11-13 DIAGNOSIS — M545 Low back pain, unspecified: Secondary | ICD-10-CM

## 2015-11-13 DIAGNOSIS — M6281 Muscle weakness (generalized): Secondary | ICD-10-CM

## 2015-11-13 DIAGNOSIS — R2689 Other abnormalities of gait and mobility: Secondary | ICD-10-CM

## 2015-11-13 NOTE — Therapy (Signed)
[facility] [address] [place], [place], [address] Phone: [phone]   Fax:  [phone]  Physical Therapy Treatment  Patient Details  Name: Karen Davis MRN: [id number] Date of Birth: 04-Nov-1970 Referring Provider: Metta Clines, DO  Encounter Date: 11/13/2015      PT End of Session - 11/13/15 1023    Visit Number 7   Number of Visits 9   Date for PT Re-Evaluation 11/17/15   Authorization Type BCBS   PT Start Time 1020   PT Stop Time 1100   PT Time Calculation (min) 40 min   Equipment Utilized During Treatment Other (comment)  sinmulated SI belt   Activity Tolerance Patient tolerated treatment well   Behavior During Therapy [facility] for tasks assessed/performed      Past Medical History:  Diagnosis Date  . Anemia   . Migraines     Past Surgical History:  Procedure Laterality Date  . FINGER SURGERY Right    pinkie   . KNEE SURGERY Right    x2  . PILONIDAL CYST EXCISION    . TEAR DUCT PROBING Left     There were no vitals filed for this visit.      Subjective Assessment - 11/13/15 1021    Subjective No new complaints. No falls.    Pertinent History PMH significant for: iron deficiency anemia, depression and multinodular goiter, pilonidal cyst excision (when pt was in highschool), R knee arthroscopy surgery x2, surgical excision of glass from R hand   Patient Stated Goals "To get rid of the pain in my back."   Currently in Pain? Yes   Pain Score 2    Pain Location Back   Pain Orientation Right;Left;Lower   Pain Descriptors / Indicators Tender;Sore   Pain Type Chronic pain   Pain Radiating Towards no radiating pain for past several days   Pain Onset More than a month ago   Pain Frequency Intermittent   Aggravating Factors  positional change   Pain Relieving Factors weight shifting to left LE           [facility] Adult PT Treatment/Exercise - 11/13/15 1301      Manual Therapy   Manual Therapy  Soft tissue mobilization;Myofascial release   Manual therapy comments Manual therapy to B paraspinals and upper buttocks, right> left side to decr pain and incr functional mobility.     Soft tissue mobilization lumbar paraspinals, gluts and QL   Myofascial Release lumbar paraspinals, gluts and QL   Other Manual Therapy gentle overpressure concurrent with exhales for lumbar distraction in prone for improve alignment and decreased muscular tightness        Exercises: all exercises performed with simulated SIJ belt in place. Cues on form and technique with ex's provided as needed.  Sit<>stand's without UE suppport - With ball squeeze between knees 2 sets  x 10 reps to promote correct LE alignment with transfer  Hook lying on mat - bridge position hold while alternating marching x 10 reps each leg - clam shells with green band, 5 sec holds x 10 reps - bridge with clam shells, 5 sec holds x 10 reps  Side lying: pillow between knees Clam shell x 10 reps each side, no resistance  Prone: - alternating leg raises with knee extension - heel squeezes  Standing - With red band around legs above knee: side stepping left<>right for 20 feet each way with min HHA for balance, cues on form and to not let hips  rotate.          PT Short Term Goals - 10/18/15 1230      PT SHORT TERM GOAL #1   Title STG's = LTG's           PT Long Term Goals - 10/18/15 1231      PT LONG TERM GOAL #1   Title Pt will independently perform HEP to maximize functional gains made in PT.  (Target: 11/15/15)     PT LONG TERM GOAL #2   Title Oswestry score will improve from 26% to </= 16% to indicate significant decrease in back pain-related disability. (11/15/15)     PT LONG TERM GOAL #3   Title Pt will verbalize understanding of 2 ways to mitigate pain to indicate effective self-management of symptoms.  (11/15/15)     PT LONG TERM GOAL #4   Title Pt will engage in >/= 30 consecutive minutes of exercise  with no more than 2-point increase in back pain (on 10-point scale) to indicate improved activity tolerance.  (11/15/15)           Plan - 11/13/15 1023    Clinical Impression Statement Today's session continued to focus on pain reduction and core/pelvic strengthening. Pt reported decreaesed pain with manual therapy, however had slight increase back to baseline value with exercises. Plan discussed with primary PT as end of plan is this week, will plan to refer to ortho clinic for further evaluation/treatment due to lack of fuctional progress.                                              Rehab Potential Good   PT Frequency 2x / week   PT Duration 4 weeks   PT Treatment/Interventions ADLs/Self Care Home Management;Cryotherapy;Biofeedback;Moist Heat;Gait training;Stair training;Functional mobility training;Therapeutic activities;Patient/family education;Neuromuscular re-education;Balance training;Therapeutic exercise;Manual techniques;Passive range of motion   PT Next Visit Plan   1) Pain control. After pain better-controlled, 2) focus on core stabilization. Focus on mechanics (avoid genu valgum) during functional activities (sit <> stand, bridging) within pain-free parameters. continue to use SIJ belt with exericses if that helped with today's session.                  PT Home Exercise Plan core stabilization exercises   Consulted and Agree with Plan of Care Patient      Patient will benefit from skilled therapeutic intervention in order to improve the following deficits and impairments:  Abnormal gait, Improper body mechanics, Decreased strength, Hypermobility, Other (comment), Postural dysfunction, Pain (decreased endurance of core stabilization muscles)  Visit Diagnosis: Right-sided low back pain without sciatica  Muscle weakness (generalized)  Other abnormalities of gait and mobility     Problem List There are no active problems to display for this patient.   Willow Ora, PTA,  [facility] [address] [place], [place] [address] [phone] 11/14/15, 1:11 PM   Name: Karen Davis MRN: [id number] Date of Birth: [birth year]

## 2015-11-16 ENCOUNTER — Ambulatory Visit: Payer: BLUE CROSS/BLUE SHIELD | Admitting: Physical Therapy

## 2015-11-16 ENCOUNTER — Encounter: Payer: Self-pay | Admitting: Physical Therapy

## 2015-11-16 DIAGNOSIS — M545 Low back pain, unspecified: Secondary | ICD-10-CM

## 2015-11-16 DIAGNOSIS — M6281 Muscle weakness (generalized): Secondary | ICD-10-CM

## 2015-11-16 DIAGNOSIS — R2689 Other abnormalities of gait and mobility: Secondary | ICD-10-CM

## 2015-11-16 NOTE — Therapy (Signed)
[facility] [address] [place], [place], [address] Phone: [phone]   Fax:  [phone]  Physical Therapy Treatment  Patient Details  Name: Karen Davis MRN: [id number] Date of Birth: [birth year] Referring Provider: Metta Clines, DO  Encounter Date: 11/16/2015      PT End of Session - 11/16/15 1025    Visit Number 8   Number of Visits 9   Date for PT Re-Evaluation 11/17/15   Authorization Type BCBS   PT Start Time 1017   PT Stop Time 1100   PT Time Calculation (min) 43 min   Equipment Utilized During Treatment --   Activity Tolerance Patient tolerated treatment well   Behavior During Therapy [facility] for tasks assessed/performed      Past Medical History:  Diagnosis Date  . Anemia   . Migraines     Past Surgical History:  Procedure Laterality Date  . FINGER SURGERY Right    pinkie   . KNEE SURGERY Right    x2  . PILONIDAL CYST EXCISION    . TEAR DUCT PROBING Left     There were no vitals filed for this visit.      Subjective Assessment - 11/16/15 1023    Subjective Pt reporting increased pain after last session that started the next day. No falls.    Pertinent History PMH significant for: iron deficiency anemia, depression and multinodular goiter, pilonidal cyst excision (when pt was in highschool), R knee arthroscopy surgery x2, surgical excision of glass from R hand   Patient Stated Goals "To get rid of the pain in my back."   Currently in Pain? Yes   Pain Score 8    Pain Location Back   Pain Orientation Right;Left;Lower   Pain Descriptors / Indicators Aching;Sharp;Sore;Discomfort   Pain Type Chronic pain   Pain Radiating Towards radiating down into both legs today   Pain Onset More than a month ago   Pain Frequency Constant   Aggravating Factors  sitting, standing,    Pain Relieving Factors nothing at this time           [facility] Adult PT Treatment/Exercise - 11/16/15 1036      Manual  Therapy   Manual Therapy Soft tissue mobilization;Myofascial release;Manual Traction;Muscle Energy Technique;Other (comment)   Manual therapy comments Manual therapy to B paraspinals and upper buttocks, right> left side to decr pain and incr functional mobility.     Soft tissue mobilization lumbar paraspinals, gluts and QL   Myofascial Release lumbar paraspinals, gluts and QL   Manual Traction manual sheet traction with prolonged holds of 1 minute, x 5 reps for decreased bil LE radicular pain. Pt did report decreased pain with radicular pain moving from LE's to knees into buttocks only                                              Other Manual Therapy gentle overpressure at pelvis/iliac crest for lumbar/sacral distraction in prone for pain reduction and decreased muscular tightness; hook lying with feet on red pball: passive bil knee to chest for lower back stretch, only going to point pt reported centralization of pain to no pain, 30 sec holds x 5 reps.  PT Short Term Goals - 10/18/15 1230      PT SHORT TERM GOAL #1   Title STG's = LTG's           PT Long Term Goals - 11/16/15 1025      PT LONG TERM GOAL #1   Title Pt will independently perform HEP to maximize functional gains made in PT.  (Target: 11/15/15)   Baseline 11/16/15: has been trying them when pain allows   Status Achieved     PT LONG TERM GOAL #2   Title Oswestry score will improve from 26% to </= 16% to indicate significant decrease in back pain-related disability. (11/15/15)   Baseline 11/16/15: not reassessed due to renewal/transfer   Status On-going     PT LONG TERM GOAL #3   Title Pt will verbalize understanding of 2 ways to mitigate pain to indicate effective self-management of symptoms.  (11/15/15)   Baseline 11/16/15: using heat/ice as needed, is more aware of her posture with standing up/sitting posture to not rotate, stretching   Status Achieved     PT LONG  TERM GOAL #4   Title Pt will engage in >/= 30 consecutive minutes of exercise with no more than 2-point increase in back pain (on 10-point scale) to indicate improved activity tolerance.  (11/15/15)   Baseline 11/16/15: pain continues to increase with all activity, has not reached 30 minute mark as yet   Status On-going           Plan - 11/16/15 1025    Clinical Impression Statement Today's skilled session addressed LTGs and then focused on pain reduction. Pt did have reduced pain at the end of the session, reporting 5/10 (from 8/10). Discussed with pt transfer to ortho clinic for further evaluation from ortho PT that may be able to offer other treatment options as nothing done in this clinic is keeping her pain reduced. (Primary PT, Blaire Hobble had already stated this as new plan due to at end of plan of care). Pt will need reeval/renewal at next appt.  Pt should benefit from continued PT to progress toward goals not met.                                           Rehab Potential Good   PT Frequency 2x / week   PT Duration 4 weeks   PT Treatment/Interventions ADLs/Self Care Home Management;Cryotherapy;Biofeedback;Moist Heat;Gait training;Stair training;Functional mobility training;Therapeutic activities;Patient/family education;Neuromuscular re-education;Balance training;Therapeutic exercise;Manual techniques;Passive range of motion   PT Next Visit Plan transfering pt to [facility]o at [facility], pt will need renewal at next appt   PT Home Exercise Plan core stabilization exercises   Consulted and Agree with Plan of Care Patient      Patient will benefit from skilled therapeutic intervention in order to improve the following deficits and impairments:  Abnormal gait, Improper body mechanics, Decreased strength, Hypermobility, Other (comment), Postural dysfunction, Pain (decreased endurance of core stabilization muscles)  Visit Diagnosis: Right-sided low back pain without sciatica  Muscle weakness  (generalized)  Other abnormalities of gait and mobility     Problem List There are no active problems to display for this patient.   Willow Ora, PTA, [facility] [address] [place], [place] [address] [phone] 11/16/15, 12:17 PM   Name: Karen Davis MRN: [id number] Date of Birth: 1970/07/04

## 2015-11-20 ENCOUNTER — Ambulatory Visit: Payer: BLUE CROSS/BLUE SHIELD | Admitting: Physical Therapy

## 2015-11-20 DIAGNOSIS — M545 Low back pain, unspecified: Secondary | ICD-10-CM

## 2015-11-20 DIAGNOSIS — R2689 Other abnormalities of gait and mobility: Secondary | ICD-10-CM

## 2015-11-20 DIAGNOSIS — M6281 Muscle weakness (generalized): Secondary | ICD-10-CM

## 2015-11-20 NOTE — Therapy (Addendum)
[place], [place], [address] Phone: [phone]   Fax:  [phone]  Physical Therapy Treatment  Patient Details  Name: Karen Davis MRN: [id number] Date of Birth: Jan 29, 1971 Referring Provider: Metta Clines, DO  Encounter Date: 11/20/2015      PT End of Session - 11/20/15 0947    Visit Number 9   Number of Visits 25   Date for PT Re-Evaluation 01/15/16   PT Start Time 0844   PT Stop Time 0938   PT Time Calculation (min) 54 min   Activity Tolerance Patient tolerated treatment well   Behavior During Therapy [facility] for tasks assessed/performed      Past Medical History:  Diagnosis Date  . Anemia   . Migraines     Past Surgical History:  Procedure Laterality Date  . FINGER SURGERY Right    pinkie   . KNEE SURGERY Right    x2  . PILONIDAL CYST EXCISION    . TEAR DUCT PROBING Left     There were no vitals filed for this visit.      Subjective Assessment - 11/20/15 0846    Subjective Pt transfers from Neuro.  She has no more tingling in her leg.  Pain is still in low back and in to Rt. buttock.  Sometimes in groin. Overall is 30% better.     Pertinent History Feels she may have broken her tailbone 1.5 yrs ago.    Limitations Sitting;Lifting;Standing;Walking;House hold activities;Other (comment)  work, sleep    How long can you sit comfortably? Sit to stand (weight), less than 10    How long can you stand comfortably? as long as I keep moving, I am good.    How long can you walk comfortably? Walking is Ok 0.5 mile needs to rest.    Diagnostic tests none , has not seen orthopedic   Patient Stated Goals "To get rid of the pain in my back."   Currently in Pain? Yes   Pain Score 5    Pain Location Back   Pain Orientation Right;Lower   Pain Descriptors / Indicators Sore;Tightness;Other (Comment)  stiffness   Pain Type Chronic pain   Pain Onset More than a month ago  >1 yr    Pain Frequency  Constant   Aggravating Factors  sitting, standing in one place, bending, transition   Pain Relieving Factors traction , some of my exercises   Effect of Pain on Daily Activities work, home, driving            [facility] PT Assessment - 11/20/15 0851      Assessment   Medical Diagnosis Lumbosacral radiculopathy   Onset Date/Surgical Date --  chronic, worse over past yr     Precautions   Precautions None     Restrictions   Weight Bearing Restrictions No     Balance Screen   Has the patient fallen in the past 6 months No   Has the patient had a decrease in activity level because of a fear of falling?  No   Is the patient reluctant to leave their home because of a fear of falling?  No     Home Environment   Living Environment Private residence   Living Arrangements Spouse/significant other;Children   Type of [place] to enter   Entrance Stairs-Number of Steps 1 threshold into door followed by 8 steps into [place] Left   Home  Layout Multi-level   Alternate Level Stairs-Number of Steps 7   Alternate Level Stairs-Rails Left   Home Equipment None     Prior Function   Level of Independence Independent   Vocation Full time employment   Vocation Requirements FT job as maintenance and Horticulturist, commercial (desk job); on Retail banker for additional part-time job in [place] son play lacrosse     Cognition   Overall Cognitive Status Within Functional Limits for tasks assessed     Sensation   Light Touch Appears Intact  BLE's   Proprioception Appears Intact  BLE's     Posture/Postural Control   Posture/Postural Control Postural limitations   Postural Limitations Right pelvic obliquity   Posture Comments Rt iliac crest slightly higher, symmetrical skin fold, Rt. ASIS lower than L, PSIS appear level , Rt. leg goes from even to longer in long sitting (Rt. anterior innominate)     AROM   Lumbar Flexion WNL with pain  end range    Lumbar Extension WNL min pain    Lumbar - Right Side Bend pain   Lumbar - Left Side Bend WNL felt good      Strength   Right Hip Flexion 4+/5  pain   Right Hip Extension 4+/5   Right Hip ABduction 4-/5   Left Hip Flexion 5/5   Left Hip Extension 5/5   Left Hip ABduction 5/5   Right Knee Flexion 4+/5   Right Knee Extension 4+/5  pain in knee   Left Knee Flexion 5/5   Left Knee Extension 4+/5     Flexibility   Hamstrings tighter L than Rt., still WFL   Quadriceps tight bilateral    ITB sore Rt. lateral hip    Piriformis not painful or tender Rt or Lt.    Quadratus Lumborum normal tone      Palpation   Spinal mobility good throughout    SI assessment  painful to lie on L side (compression)    Palpation comment see above.  Pain reproduced Rt. superior gluteals, glute med, L5 -S1 and into Rt TFL                      [facility] Adult PT Treatment/Exercise - 11/20/15 0851      Self-Care   Self-Care Posture;Heat/Ice Application;Other Self-Care Comments   Posture avoid sitting, standing desk    Heat/Ice Application ice if pain post traction increases    Other Self-Care Comments  MET, anatomy     Lumbar Exercises: Sidelying   Other Sidelying Lumbar Exercises stretch for Rt. lateral trunk (QL) with manual      Traction   Type of Traction Lumbar   Min (lbs) 40   Max (lbs) 55   Hold Time 60   Rest Time 15   Time 15     Manual Therapy   Soft tissue mobilization Rt. gluteals into lateral hip    Muscle Energy Technique MET for Rt. ant pelvis: resisted Rt. hamstring followed by prolonged knee to chest   6 x 8 sec hold by PT                 PT Education - 11/20/15 0945    Education provided Yes   Education Details POC at Ortho, rec see Ortho, safety with supine to sit, body mechanics, traction, dry needling and MET.    Person(s) Educated Patient   Methods Explanation;Demonstration;Tactile cues;Verbal cues   Comprehension Verbalized  understanding;Returned demonstration;Need  further instruction          PT Short Term Goals - 11/20/15 0957      PT SHORT TERM GOAL #1   Title Pt will be I with initial HEP for hip, core.    Time 2   Period Weeks   Status New     PT SHORT TERM GOAL #2   Title Pt will demo and perform consistently proper body mechanics with ADLs, reaching and transfers from mat.    Time 2   Period Weeks   Status New           PT Long Term Goals - 11/20/15 3491      PT LONG TERM GOAL #1   Title Pt will independently perform HEP to maximize functional gains made in PT   Time 8   Period Weeks   Status On-going     PT LONG TERM GOAL #2   Title Oswestry score will improve from 26% to </= 16% to indicate significant decrease in back pain-related disability. (11/15/15)   Time 8   Period Weeks   Status Unable to assess     PT LONG TERM GOAL #3   Title Pt will verbalize understanding of 2 ways to mitigate pain to indicate effective self-management of symptoms.  (11/15/15)   Time 8   Period Weeks   Status Achieved     PT LONG TERM GOAL #4   Title Pt will engage in >/= 30 consecutive minutes of exercise with no more than 2-point increase in back pain (on 10-point scale) to indicate improved activity tolerance.   Time 8   Period Weeks   Status On-going     PT LONG TERM GOAL #5   Title Pt will be able to report min sleep disturbance due to pain    Time 8   Period Weeks   Status New     Additional Long Term Goals   Additional Long Term Goals Yes     PT LONG TERM GOAL #6   Title Pt will be able to sit for up to 30 min with only min pain increase from baseline for driving, meals and work.    Time 8   Period Weeks   Status New               Plan - 11/20/15 0913    Clinical Impression Statement Pt presents for renewal of POC at our [place] clinic for ongoing, chronic, daily low back pain, stiffness and referral into proximal hip.  She has responded well to manual work, manual  traction but has not focused on core stabilization.  She has a mild ant rotation of Rt. innomminate and definite muscle imbalance.  She will benefit from more intensive core training along and reinforcement of body posture, and other modalities to further improve her functional mobility.    Rehab Potential Excellent   PT Frequency 2x / week   PT Duration 8 weeks   PT Treatment/Interventions ADLs/Self Care Home Management;Cryotherapy;Biofeedback;Moist Heat;Gait training;Stair training;Functional mobility training;Therapeutic activities;Patient/family education;Neuromuscular re-education;Balance training;Therapeutic exercise;Manual techniques;Passive range of motion;Traction;Ultrasound;Dry needling;Taping;Iontophoresis 36m/ml Dexamethasone;Electrical Stimulation;Other (comment)  Pilates based PT    PT Next Visit Plan assess traction effect, review stab for lumbopelvic and stretch hip (piri, TFL), consider dry needling , given ODI   PT Home Exercise Plan core stabilization exercises- given by Neuro, please review   Consulted and Agree with Plan of Care Patient      Patient will benefit from skilled therapeutic intervention  in order to improve the following deficits and impairments:  Improper body mechanics, Decreased strength, Hypermobility, Other (comment), Postural dysfunction, Pain, Decreased range of motion, Increased fascial restricitons, Decreased mobility  Visit Diagnosis: Right-sided low back pain without sciatica  Muscle weakness (generalized)  Other abnormalities of gait and mobility     Problem List There are no active problems to display for this patient.   Holden Maniscalco 11/20/2015, 10:09 AM  [facility] [address] [place], [place], [address] Phone: [phone]   Fax:  [phone]  Name: Karen Davis MRN: [id number] Date of Birth: [birth year]  PHYSICAL THERAPY DISCHARGE SUMMARY  Visits from Start of Care:  9  Current functional level related to goals / functional outcomes: See above   Remaining deficits: See above    Education / Equipment: Reinforced HEP  Plan: Patient agrees to discharge.  Patient goals were not met. Patient is being discharged due to the patient's request.  ?????    Pt will not be able to afford PT.    Raeford Razor, PT 11/20/15 10:10 AM Phone: [phone] Fax: [phone]

## 2015-11-26 ENCOUNTER — Ambulatory Visit: Payer: BLUE CROSS/BLUE SHIELD | Admitting: Physical Therapy

## 2015-11-28 ENCOUNTER — Ambulatory Visit: Payer: BLUE CROSS/BLUE SHIELD | Admitting: Physical Therapy

## 2015-12-03 ENCOUNTER — Ambulatory Visit: Payer: BLUE CROSS/BLUE SHIELD | Admitting: Physical Therapy

## 2015-12-05 ENCOUNTER — Encounter: Payer: BLUE CROSS/BLUE SHIELD | Admitting: Physical Therapy

## 2015-12-10 ENCOUNTER — Encounter: Payer: BLUE CROSS/BLUE SHIELD | Admitting: Physical Therapy

## 2015-12-12 ENCOUNTER — Encounter: Payer: BLUE CROSS/BLUE SHIELD | Admitting: Physical Therapy

## 2015-12-13 ENCOUNTER — Ambulatory Visit: Payer: BLUE CROSS/BLUE SHIELD | Admitting: Physical Therapy

## 2015-12-17 ENCOUNTER — Encounter: Payer: BLUE CROSS/BLUE SHIELD | Admitting: Physical Therapy

## 2015-12-19 ENCOUNTER — Encounter: Payer: BLUE CROSS/BLUE SHIELD | Admitting: Physical Therapy

## 2016-01-08 ENCOUNTER — Ambulatory Visit (INDEPENDENT_AMBULATORY_CARE_PROVIDER_SITE_OTHER): Payer: BLUE CROSS/BLUE SHIELD | Admitting: Neurology

## 2016-01-08 ENCOUNTER — Encounter: Payer: Self-pay | Admitting: Neurology

## 2016-01-08 VITALS — BP 104/70 | HR 71 | Ht 63.0 in | Wt 146.1 lb

## 2016-01-08 DIAGNOSIS — R200 Anesthesia of skin: Secondary | ICD-10-CM | POA: Diagnosis not present

## 2016-01-08 DIAGNOSIS — G43009 Migraine without aura, not intractable, without status migrainosus: Secondary | ICD-10-CM

## 2016-01-08 DIAGNOSIS — M545 Low back pain: Secondary | ICD-10-CM | POA: Diagnosis not present

## 2016-01-08 DIAGNOSIS — G8929 Other chronic pain: Secondary | ICD-10-CM

## 2016-01-08 DIAGNOSIS — M5417 Radiculopathy, lumbosacral region: Secondary | ICD-10-CM | POA: Diagnosis not present

## 2016-01-08 NOTE — Patient Instructions (Addendum)
Continue verapamil ER 120mg  daily Continue sumatriptan as needed Use wrist splints Follow up in 6 months.

## 2016-01-08 NOTE — Progress Notes (Signed)
NEUROLOGY FOLLOW UP OFFICE NOTE  MAIKAYLA CASADOS [id number]  HISTORY OF PRESENT ILLNESS: Kazuko Krupnick is a 45 year old right-handed woman with iron deficiency anemia, depression and multinodular goiter who follows up for migraine.  UPDATE: ECG from 10/22/15 showed sinus rhythm of 52 bpm with PR 144 and QT/QTc 412/398 NCV-EMG was normal.  No evidence of carpal tunnel.  She does wear wrist splints at night when it flares up and it helps. She had PT for low back pain.  No radicular symptoms anymore.  It was helpful but she had to stop due to finances.  Headaches are improved. Intensity:  10/10 Duration:  30 minutes with sumatriptan Frequency:  No migraines since end of August. Current NSAIDS:  no Current analgesics:  no Current triptans:  Second line:  sumatriptan 100mg   Current anti-emetic:  no Current muscle relaxants:  no Current anti-anxiolytic:  no Current sleep aide:  no Current Antihypertensive medications:  verapamil HCL ER 120mg  Current Antidepressant medications:  no Current Anticonvulsant medications:  no Current Vitamins/Herbal/Supplements:  Centrum, iron Current Antihistamines/Decongestants:  no Other therapy:  no   Caffeine:  Coffee, tea, soda Alcohol:  no Smoker:  no Diet:  Needs to increase hydration Exercise:  Not routine Depression/stress:  controlled Sleep hygiene:  poor   HISTORY: Onset:  About 3 or 4 years ago Location:  Right maxillary/periorbital region, sometimes radiating to occipital region Quality:  Pressure, progressing to stabbing Intensity:  10/10 Aura:  no Prodrome:  no Associated symptoms:  Nausea, photophobia Duration:  30 minutes with sumatriptan, otherwise several hours Frequency:  6 days per month Triggers/exacerbating factors:  Change in barometric pressure, heat Relieving factors:  sumatriptan Activity:  Needs to lay down   Past NSAIDS:  flurbiprofen, oxaprozin, ibuprofen, ketoprofen, etodolac, Mobic Past analgesics:   Excedrin, Tylenol Past abortive triptans:  no Past muscle relaxants:  no Past anti-emetic:  no Past anti-anxiolytic:  no Past sleep aide:  no Past antihypertensive medications:  no Past antidepressant medications:  no Past anticonvulsant medications:  Topiramate (effective but caused diarrhea) Past vitamins/Herbal/Supplements:  riboflavin Past antihistamines/decongestants:  no   Family history of headache:  Mother had migraines.  Brother had stroke in his 37s.  Paternal great grandfather had stroke in his 37s.   MRI and MRA of head and MRA of neck from 11/12/13 were personally reviewed and were unremarkable.   She also reports bilateral hand numbness and tingling that can radiate up to above elbows.  They are prominent when in bed and often wakes her up.  She denies neck pain or weakness.   She also has chronic low back pain since slipping and falling on her coccyx about 2 years ago.  She feels right lower back pain radiating down into her buttocks and associated with numbness and tingling of her anterior thigh.  There is no weakness.  PAST MEDICAL HISTORY: Past Medical History:  Diagnosis Date  . Anemia   . Migraines     MEDICATIONS: Current Outpatient Prescriptions on File Prior to Visit  Medication Sig Dispense Refill  . ferrous sulfate 325 (65 FE) MG EC tablet Take 325 mg by mouth 3 (three) times daily with meals.    . meloxicam (MOBIC) 7.5 MG tablet   1  . Multiple Vitamin (MULTIVITAMIN) tablet Take 1 tablet by mouth daily.    . SUMAtriptan (IMITREX) 100 MG tablet Take 1tab at earliest onset of headache.  May repeat once in 2 hours if needed. 10 tablet 2  . verapamil (  VERELAN PM) 120 MG 24 hr capsule Take 2 capsules (240 mg total) by mouth at bedtime. 60 capsule 2   No current facility-administered medications on file prior to visit.     ALLERGIES: Allergies  Allergen Reactions  . Vantin [Cefpodoxime]     FAMILY HISTORY: Family History  Problem Relation Age of Onset   . Melanoma Mother   . Liver disease Father   . Stroke Brother     SOCIAL HISTORY: Social History   Social History  . Marital status: Married    Spouse name: N/A  . Number of children: N/A  . Years of education: N/A   Occupational History  . Not on file.   Social History Main Topics  . Smoking status: Former Smoker    Quit date: 10/08/2003  . Smokeless tobacco: Never Used  . Alcohol use No  . Drug use: No  . Sexual activity: Not on file   Other Topics Concern  . Not on file   Social History Narrative  . No narrative on file    REVIEW OF SYSTEMS: Constitutional: No fevers, chills, or sweats, no generalized fatigue, change in appetite Eyes: No visual changes, double vision, eye pain Ear, nose and throat: No hearing loss, ear pain, nasal congestion, sore throat Cardiovascular: No chest pain, palpitations Respiratory:  No shortness of breath at rest or with exertion, wheezes GastrointestinaI: No nausea, vomiting, diarrhea, abdominal pain, fecal incontinence Genitourinary:  No dysuria, urinary retention or frequency Musculoskeletal:  back pain Integumentary: No rash, pruritus, skin lesions Neurological: as above Psychiatric: No depression, insomnia, anxiety Endocrine: No palpitations, fatigue, diaphoresis, mood swings, change in appetite, change in weight, increased thirst Hematologic/Lymphatic:  No purpura, petechiae. Allergic/Immunologic: no itchy/runny eyes, nasal congestion, recent allergic reactions, rashes  PHYSICAL EXAM: Vitals:   01/08/16 1132  BP: 104/70  Pulse: 71   General: No acute distress.  Patient appears well-groomed.  normal body habitus. Head:  Normocephalic/atraumatic Eyes:  Fundi examined but not visualized Neck: supple, no paraspinal tenderness, full range of motion Heart:  Regular rate and rhythm Lungs:  Clear to auscultation bilaterally Back: No paraspinal tenderness Neurological Exam: alert and oriented to person, place, and time.  Attention span and concentration intact, recent and remote memory intact, fund of knowledge intact.  Speech fluent and not dysarthric, language intact.  CN II-XII intact. Bulk and tone normal, muscle strength 5/5 throughout.  Sensation to light touch  intact.  Deep tendon reflexes 2+ throughout.  Finger to nose testing intact.  Gait normal  IMPRESSION: Migraine Bilateral hand numbness.  I stills suspect mild carpal tunnel, despite normal NCV. Chronic low back pain  PLAN: 1.  Continue verapamil ER 120mg  daily 2.  Sumatriptan 100mg  as needed for migraine 3.  Wrist splints for probable carpal tunnel 4.  If back pain not improves, consider referral to Dr. Hulan Saas for OMT. 5.  Follow up in 6 months.  22 minutes spent face to face with patient, over 50% spent counseling.  Metta Clines, DO  CC:  Lujean Amel, MD

## 2016-01-22 ENCOUNTER — Other Ambulatory Visit: Payer: Self-pay | Admitting: Neurology

## 2016-01-22 NOTE — Telephone Encounter (Signed)
PLAN: 1.  Continue verapamil ER 120mg  daily

## 2016-04-17 ENCOUNTER — Other Ambulatory Visit: Payer: Self-pay | Admitting: Neurology

## 2016-04-25 ENCOUNTER — Other Ambulatory Visit: Payer: Self-pay | Admitting: Neurology

## 2016-04-25 NOTE — Telephone Encounter (Signed)
Note says "continue Verapamil 120 mg daily" But request is for two tablets at bedtime.  Please verify dosage?

## 2016-06-03 ENCOUNTER — Telehealth: Payer: Self-pay

## 2016-06-03 NOTE — Telephone Encounter (Signed)
Recvd fax from [facility]. Pt states she is going into depression and feeling foggy in the mornings after taking BP med. (Verapamil) Pt stopped taking med and is feeling better. Requesting advice. Called patient. No answer.

## 2016-07-07 ENCOUNTER — Ambulatory Visit (INDEPENDENT_AMBULATORY_CARE_PROVIDER_SITE_OTHER): Payer: BLUE CROSS/BLUE SHIELD | Admitting: Neurology

## 2016-07-07 ENCOUNTER — Encounter: Payer: Self-pay | Admitting: Neurology

## 2016-07-07 VITALS — BP 132/82 | HR 86 | Ht 63.0 in | Wt 161.0 lb

## 2016-07-07 DIAGNOSIS — G43709 Chronic migraine without aura, not intractable, without status migrainosus: Secondary | ICD-10-CM

## 2016-07-07 DIAGNOSIS — M5417 Radiculopathy, lumbosacral region: Secondary | ICD-10-CM | POA: Diagnosis not present

## 2016-07-07 MED ORDER — SUMATRIPTAN SUCCINATE 100 MG PO TABS: ORAL_TABLET | ORAL | 3 refills | Status: DC

## 2016-07-07 MED ORDER — NORTRIPTYLINE HCL 25 MG PO CAPS
25.0000 mg | ORAL_CAPSULE | Freq: Every day | ORAL | 3 refills | Status: DC
Start: 2016-07-07 — End: 2016-10-21

## 2016-07-07 NOTE — Progress Notes (Signed)
NEUROLOGY FOLLOW UP OFFICE NOTE  ARBOR LEER [id number]  HISTORY OF PRESENT ILLNESS: Karen Davis is a 46 year old right-handed woman with iron deficiency anemia, depression and multinodular goiter who follows up for migraine.   UPDATE: Migraines had been well-controlled on verapamil.  However, she started to feel depressed and "foggy" and discontinued it a little over a month ago.  She felt better afterwards, however she has not been on a preventative and migraines have become more frequent.  Also, since discontinuing the verapamil, her back pain has increased. Intensity:  8/10 Duration:  Usually 1 hour Frequency:  Every other day (15 headache days in last month) Current NSAIDS:  no Current analgesics:  no Current triptans:  sumatriptan 100mg   Current anti-emetic:  no Current muscle relaxants:  no Current anti-anxiolytic:  no Current sleep aide:  no Current Antihypertensive medications:   Current Antidepressant medications:  no Current Anticonvulsant medications:  no Current Vitamins/Herbal/Supplements:  Centrum, iron Current Antihistamines/Decongestants:  no Other therapy:  no   Caffeine:  Coffee, tea, soda Alcohol:  no Smoker:  no Diet:  Needs to increase hydration Exercise:  Not routine Depression/stress:  controlled Sleep hygiene:  poor    HISTORY: Onset:  About 3 or 4 years ago Location:  Right maxillary/periorbital region, sometimes radiating to occipital region Quality:  Pressure, progressing to stabbing Initial Intensity:  10/10; November: 10/10 Aura:  no Prodrome:  no Associated symptoms:  Nausea, photophobia Initial Duration:  30 minutes with sumatriptan, otherwise several hours; November: 30 minutes with sumatriptan Initial Frequency:  6 days per month; November: reported no migraines for over 2 months. Triggers/exacerbating factors:  Change in barometric pressure, heat Relieving factors:  sumatriptan Activity:  Needs to lay down   Past NSAIDS:   flurbiprofen, oxaprozin, ibuprofen, ketoprofen, etodolac, Mobic Past analgesics:  Excedrin, Tylenol Past abortive triptans:  no Past muscle relaxants:  no Past anti-emetic:  no Past anti-anxiolytic:  no Past sleep aide:  no  Past antihypertensive medications:  verapamil HCL ER 120mg  (was effective but she stopped due to feeling depressed) Past antidepressant medications:  no Past anticonvulsant medications:  Topiramate (effective but caused diarrhea) Past vitamins/Herbal/Supplements:  riboflavin Past antihistamines/decongestants:  no   Family history of headache:  Mother had migraines.  Brother had stroke in his 59s.  Paternal great grandfather had stroke in his 40s.   MRI and MRA of head and MRA of neck from 11/12/13 were personally reviewed and were unremarkable.   She also reports bilateral hand numbness and tingling that can radiate up to above elbows.  They are prominent when in bed and often wakes her up.  She denies neck pain or weakness.  NCV-EMG was normal.  No evidence of carpal tunnel.  She does wear wrist splints at night when it flares up and it helps.   She also has chronic low back pain since slipping and falling on her coccyx about 2 years ago.  She feels right lower back pain radiating down into her buttocks and associated with numbness and tingling of her anterior thigh.  There is no weakness.  She had PT for low back pain.  No radicular symptoms anymore.  It was helpful but she had to stop due to finances.  PAST MEDICAL HISTORY: Past Medical History:  Diagnosis Date  . Anemia   . Migraines     MEDICATIONS: No current outpatient prescriptions on file prior to visit.   No current facility-administered medications on file prior to visit.  ALLERGIES: Allergies  Allergen Reactions  . Vantin [Cefpodoxime]     FAMILY HISTORY: Family History  Problem Relation Age of Onset  . Melanoma Mother   . Liver disease Father   . Stroke Brother     SOCIAL  HISTORY: Social History   Social History  . Marital status: Married    Spouse name: N/A  . Number of children: N/A  . Years of education: N/A   Occupational History  . Not on file.   Social History Main Topics  . Smoking status: Former Smoker    Quit date: 10/08/2003  . Smokeless tobacco: Never Used  . Alcohol use No  . Drug use: No  . Sexual activity: Not on file   Other Topics Concern  . Not on file   Social History Narrative  . No narrative on file    REVIEW OF SYSTEMS: Constitutional: No fevers, chills, or sweats, no generalized fatigue, change in appetite Eyes: No visual changes, double vision, eye pain Ear, nose and throat: No hearing loss, ear pain, nasal congestion, sore throat Cardiovascular: No chest pain, palpitations Respiratory:  No shortness of breath at rest or with exertion, wheezes GastrointestinaI: No nausea, vomiting, diarrhea, abdominal pain, fecal incontinence Genitourinary:  No dysuria, urinary retention or frequency Musculoskeletal:  No neck pain, back pain Integumentary: No rash, pruritus, skin lesions Neurological: as above Psychiatric: No depression, insomnia, anxiety Endocrine: No palpitations, fatigue, diaphoresis, mood swings, change in appetite, change in weight, increased thirst Hematologic/Lymphatic:  No purpura, petechiae. Allergic/Immunologic: no itchy/runny eyes, nasal congestion, recent allergic reactions, rashes  PHYSICAL EXAM: Vitals:   07/07/16 1111  BP: 132/82  Pulse: 86   General: No acute distress.  Patient appears well-groomed.  normal body habitus. Head:  Normocephalic/atraumatic Eyes:  Fundi examined but not visualized Neck: supple, no paraspinal tenderness, full range of motion Heart:  Regular rate and rhythm Lungs:  Clear to auscultation bilaterally Back: No paraspinal tenderness Neurological Exam: alert and oriented to person, place, and time. Attention span and concentration intact, recent and remote memory intact,  fund of knowledge intact.  Speech fluent and not dysarthric, language intact.  CN II-XII intact. Bulk and tone normal, muscle strength 5/5 throughout.  Sensation to light touch  intact.  Deep tendon reflexes 2+ throughout.  Finger to nose testing intact.  Gait normal, Romberg negative.  IMPRESSION: Migraine Chronic back pain  PLAN: 1.  Start nortriptyline 25mg  at bedtime.  If not effective, contact me in 4 weeks (prior to refill) and we can increase dose if needed.  It may also help with the back pain as well. 2.  Continue sumatriptan as needed 3. Lifestyle modification:  Diet, exercise, sleep hygiene, hydration  4. Follow up in 3 months.  Metta Clines, DO  CC:  Lujean Amel, MD

## 2016-07-07 NOTE — Patient Instructions (Signed)
1.  Start nortriptyline 25mg  at bedtime.  If not effective, contact me in 4 weeks (prior to refill) and we can increase dose if needed. 2.  Continue sumatriptan as needed 3.  Follow up in 3 months.

## 2016-07-07 NOTE — Progress Notes (Signed)
Office note routed to PCP.

## 2016-07-29 ENCOUNTER — Telehealth: Payer: Self-pay | Admitting: Neurology

## 2016-07-29 MED ORDER — PREDNISONE 10 MG PO TABS: 10.0000 mg | ORAL_TABLET | Freq: Every day | ORAL | 0 refills | Status: DC

## 2016-07-29 NOTE — Telephone Encounter (Signed)
Spoke with patient and she prefers to try the prednisone first  Sent prednisone taper into her pharmacy

## 2016-07-29 NOTE — Telephone Encounter (Signed)
No driver to come with her Please advise

## 2016-07-29 NOTE — Telephone Encounter (Signed)
Patient is on Day 4 of a migraine and would like to talk to someone please call

## 2016-07-29 NOTE — Telephone Encounter (Signed)
Patient calling with severe migraine for 4 days, meds not working,Took two sumatriptan on Saturday and Sunday also has taken two sumatriptan today and its not working    She is having Nausea  no vomiting  No dizziness   Today its behind left eye and left ear and its pulsating pain  Saturday pain was on right side and behind right eye  Everything is blurry when she tries to look at her computer screen  Dr Tomi Likens Please advise:

## 2016-07-29 NOTE — Telephone Encounter (Signed)
Then her options are: 1) She can come to the office by herself and we can give her a Toradol 60mg  injection. Or  2) I can prescribe her a prednisone 10mg  tablet taper (Take 6tabs x1day, then 5tabs x1day, then 4tabs x1day, then 3tabs x1day, then 2tabs x1day, then 1tab x1day, then STOP)

## 2016-07-29 NOTE — Telephone Encounter (Signed)
If she has a driver, she can come to the office for a headache cocktail (which is combination of toradol, benadryl and reglan).

## 2016-07-29 NOTE — Telephone Encounter (Signed)
Left message for patient on recommendations

## 2016-09-24 ENCOUNTER — Other Ambulatory Visit: Payer: Self-pay | Admitting: Family Medicine

## 2016-09-24 DIAGNOSIS — E049 Nontoxic goiter, unspecified: Secondary | ICD-10-CM

## 2016-09-29 ENCOUNTER — Other Ambulatory Visit: Payer: BLUE CROSS/BLUE SHIELD

## 2016-10-01 ENCOUNTER — Telehealth: Payer: Self-pay | Admitting: Neurology

## 2016-10-01 NOTE — Telephone Encounter (Signed)
We had to call this patient to reschedule her next appt with Dr. Tomi Likens on 10/17/16 due to him being out of the office. She had a question for him regarding a Trial Drug for her Migraines that her PCP mentioned to her. She wanted to know his opinion. Please Advise. Thanks

## 2016-10-01 NOTE — Telephone Encounter (Signed)
Spoke with Pt, advised her will need name of trial medication. Pt will call back with that information.

## 2016-10-02 ENCOUNTER — Telehealth: Payer: Self-pay

## 2016-10-02 NOTE — Telephone Encounter (Signed)
Pt called back with information re: trial study- [employer]. Open label center long term safety study of BHV-3000 in acute treatment of migraines. There is not a specific drug/medication name. Must turn in paper work by Friday 10/03/16. Would like your opinion

## 2016-10-03 NOTE — Telephone Encounter (Signed)
Called and advsd.

## 2016-10-03 NOTE — Telephone Encounter (Signed)
It's for Rimegepant, which is a CGRP receptor antagonist, which is the new line of migraine medications specifically designed to treat migraine that are being tested and released on the market now (only one preventative has just recently been released to the market, [name]).  Most of the medications I am aware are are preventative treatment, however.  Overall, I have no objection but I do not know the specifics of the study/trial.

## 2016-10-17 ENCOUNTER — Ambulatory Visit: Payer: BLUE CROSS/BLUE SHIELD | Admitting: Neurology

## 2016-10-21 ENCOUNTER — Inpatient Hospital Stay (HOSPITAL_COMMUNITY)
Admission: AD | Admit: 2016-10-21 | Discharge: 2016-10-21 | Disposition: A | Discharge status: Home / Self Care | Payer: BLUE CROSS/BLUE SHIELD | Source: Ambulatory Visit | Attending: Obstetrics and Gynecology | Admitting: Obstetrics and Gynecology

## 2016-10-21 DIAGNOSIS — R102 Pelvic and perineal pain: Secondary | ICD-10-CM | POA: Insufficient documentation

## 2016-10-21 DIAGNOSIS — R1030 Lower abdominal pain, unspecified: Secondary | ICD-10-CM | POA: Diagnosis not present

## 2016-10-21 DIAGNOSIS — Z87891 Personal history of nicotine dependence: Secondary | ICD-10-CM | POA: Insufficient documentation

## 2016-10-21 LAB — WET PREP, GENITAL
Clue Cells Wet Prep HPF POC: NONE SEEN
Sperm: NONE SEEN
Trich, Wet Prep: NONE SEEN
YEAST WET PREP: NONE SEEN

## 2016-10-21 LAB — URINALYSIS, ROUTINE W REFLEX MICROSCOPIC
BILIRUBIN URINE: NEGATIVE
Glucose, UA: NEGATIVE mg/dL
KETONES UR: NEGATIVE mg/dL
NITRITE: NEGATIVE
Protein, ur: NEGATIVE mg/dL
Specific Gravity, Urine: 1.013 (ref 1.005–1.030)
pH: 6 (ref 5.0–8.0)

## 2016-10-21 MED ORDER — KETOROLAC TROMETHAMINE 30 MG/ML IJ SOLN
30.0000 mg | Freq: Once | INTRAMUSCULAR | Status: AC
  Administered 2016-10-21: 30 mg via INTRAMUSCULAR
  Filled 2016-10-21: qty 1

## 2016-10-21 NOTE — MAU Provider Note (Signed)
Patient Karen Davis is a 46 y.o. non-pregnant female here with vaginal pain and bleeding that started today. She has had chronic back pain that has gotten worse over the past two weeks, and then today she felt abdominal pain and started her period. She also says she felt a mass in her uterus. She is concerned and called [facility]; their after hours nurse told her to come in.   History     CSN: [id number]  Arrival date and time: 10/21/16 1727   None     Chief Complaint  Patient presents with  . Back Pain  . Pelvic Pain  . Vaginal Bleeding   Abdominal Pain  This is a new problem. The current episode started today. The onset quality is sudden. The problem occurs intermittently. The problem has been unchanged. The abdominal pain radiates to the suprapubic region, LLQ and RLQ. The pain is aggravated by movement. The pain is relieved by nothing.  Back Pain  This is a chronic problem. The pain is present in the sacro-iliac. The pain is at a severity of 5/10. Associated symptoms include abdominal pain.   The patient also started her period today.  OB History    No data available      Past Medical History:  Diagnosis Date  . Anemia   . Migraines     Past Surgical History:  Procedure Laterality Date  . FINGER SURGERY Right    pinkie   . KNEE SURGERY Right    x2  . PILONIDAL CYST EXCISION    . TEAR DUCT PROBING Left     Family History  Problem Relation Age of Onset  . Melanoma Mother   . Liver disease Father   . Stroke Brother     Social History  Substance Use Topics  . Smoking status: Former Smoker    Quit date: 10/08/2003  . Smokeless tobacco: Never Used  . Alcohol use No    Allergies:  Allergies  Allergen Reactions  . Vantin [Cefpodoxime] Other (See Comments)    migraine    Prescriptions Prior to Admission  Medication Sig Dispense Refill Last Dose  . naproxen (NAPROSYN) 500 MG tablet Take 500-1,000 mg by mouth daily as needed for moderate pain.    10/21/2016 at Unknown time  . SUMAtriptan (IMITREX) 100 MG tablet TAKE 1TAB BY MOUTH AT EARLIEST ONSET OF HEADACHE. MAY REPEAT ONCE IN 2 HOURS IF NEEDED. 10 tablet 3 Past Week at Unknown time  . nortriptyline (PAMELOR) 25 MG capsule Take 1 capsule (25 mg total) by mouth at bedtime. (Patient not taking: Reported on 10/21/2016) 30 capsule 3 Not Taking at Unknown time  . predniSONE (DELTASONE) 10 MG tablet Take 1 tablet (10 mg total) by mouth daily with breakfast. 6 tablets Day 1 5 tablets Day 2 4 tablets Day 3 3 tablets Day 4 2 tablets Day 5 1 tablet Day 6 then stop (Patient not taking: Reported on 10/21/2016) 21 tablet 0 Not Taking at Unknown time    Review of Systems  Respiratory: Negative.   Cardiovascular: Negative.   Gastrointestinal: Positive for abdominal pain.  Genitourinary: Negative.   Musculoskeletal: Positive for back pain.  Neurological: Negative.   Psychiatric/Behavioral: Negative.    Physical Exam   Blood pressure (!) 154/82, pulse 72, temperature 98.3 F (36.8 C), temperature source Oral, resp. rate 18, weight 76.3 kg (168 lb 4 oz), SpO2 100 %.  Physical Exam  Constitutional: She is oriented to person, place, and time. She appears well-developed  and well-nourished.  HENT:  Head: Normocephalic.  Neck: Normal range of motion.  Respiratory: Effort normal.  GI: Soft. She exhibits no distension and no mass. There is no tenderness. There is no rebound and no guarding.  Abdominal exam benign, no rebound tenderness, no guarding, no masses. Bimanual exam unremarkable, no masses palpated on ovaries.   Genitourinary: Vagina normal.  Genitourinary Comments: NEFG; no lesions on vaginal walls or on cervix. Small amount of bright red blood in the vagina; no CMT, adnexal or suprapubic tenderness.   Musculoskeletal: Normal range of motion.  Neurological: She is alert and oriented to person, place, and time. She has normal reflexes.  Skin: Skin is warm and dry.  Psychiatric: She has  a normal mood and affect.    MAU Course  Procedures  MDM -Toradol 30 mg IM; patient pain now a 4.  Given that patient does not have acute signs of infection or surgical abdomen, [place] deferred at this time.  Assessment and Plan   1. Lower abdominal pain    2. Patient stable for discharge. 3. Patient to call Dr. Gregor Hams office in the morning and make an appointment for [place], endometrial biopsy and appt with Dr. Corinna Capra within the next 7 days.  4. Reviewed patient's plan of care with Dr. Royston Sinner, who agrees.   Mervyn Skeeters Kooistra CNM 10/21/2016, 8:17 PM

## 2016-10-21 NOTE — MAU Note (Signed)
Had excruciating back pain for over a year.  This past wk has gotten worse.  Today, medicine hasn't been touching it.  Started having low pelvic pain.  Had 'esure' procedure, maybe 8 yrs ago, has not had a period since,  Started bleeding at 4.

## 2016-10-21 NOTE — Discharge Instructions (Signed)

## 2016-10-22 LAB — GC/CHLAMYDIA PROBE AMP (~~LOC~~) NOT AT ARMC
Chlamydia: NEGATIVE
Neisseria Gonorrhea: NEGATIVE

## 2016-11-12 ENCOUNTER — Other Ambulatory Visit: Payer: Self-pay | Admitting: Obstetrics and Gynecology

## 2016-11-28 MED ORDER — NORTRIPTYLINE HCL 10 MG PO CAPS: 10.0000 mg | ORAL_CAPSULE | Freq: Every day | ORAL | 2 refills | Status: DC

## 2016-12-03 ENCOUNTER — Ambulatory Visit (INDEPENDENT_AMBULATORY_CARE_PROVIDER_SITE_OTHER): Payer: BLUE CROSS/BLUE SHIELD | Admitting: Neurology

## 2016-12-03 ENCOUNTER — Encounter: Payer: Self-pay | Admitting: Neurology

## 2016-12-03 ENCOUNTER — Other Ambulatory Visit: Payer: Self-pay | Admitting: *Deleted

## 2016-12-03 VITALS — BP 120/88 | HR 75 | Ht 63.0 in | Wt 166.5 lb

## 2016-12-03 DIAGNOSIS — M545 Low back pain, unspecified: Secondary | ICD-10-CM

## 2016-12-03 DIAGNOSIS — M5417 Radiculopathy, lumbosacral region: Secondary | ICD-10-CM | POA: Diagnosis not present

## 2016-12-03 DIAGNOSIS — G43009 Migraine without aura, not intractable, without status migrainosus: Secondary | ICD-10-CM

## 2016-12-03 DIAGNOSIS — R200 Anesthesia of skin: Secondary | ICD-10-CM

## 2016-12-03 DIAGNOSIS — G8929 Other chronic pain: Secondary | ICD-10-CM

## 2016-12-03 MED ORDER — ERENUMAB-AOOE 70 MG/ML ~~LOC~~ SOAJ: 70.0000 mg | SUBCUTANEOUS | 0 refills | Status: DC

## 2016-12-03 NOTE — Patient Instructions (Signed)
1.  Continue sumatriptan as needed 2.  I will have you try the new migraine preventative shot [name] (once a month) 3.  We will refer you back to physical therapy for the pinched nerve in the back 4.  Follow up in 3 months.

## 2016-12-03 NOTE — Addendum Note (Signed)
Addended by: Chester Holstein on: 12/03/2016 08:10 AM   Modules accepted: Orders
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
## 2016-12-04 ENCOUNTER — Ambulatory Visit
Admission: RE | Admit: 2016-12-04 | Discharge: 2016-12-04 | Disposition: A | Discharge status: Home / Self Care | Payer: BLUE CROSS/BLUE SHIELD | Source: Ambulatory Visit | Attending: Family Medicine | Admitting: Family Medicine

## 2016-12-04 DIAGNOSIS — E049 Nontoxic goiter, unspecified: Secondary | ICD-10-CM

## 2016-12-30 ENCOUNTER — Ambulatory Visit: Payer: BLUE CROSS/BLUE SHIELD | Attending: Neurology | Admitting: Physical Therapy

## 2016-12-30 DIAGNOSIS — G8929 Other chronic pain: Secondary | ICD-10-CM

## 2016-12-30 DIAGNOSIS — M545 Low back pain: Secondary | ICD-10-CM | POA: Diagnosis present

## 2016-12-30 NOTE — Therapy (Signed)
[place], [place], [address] Phone: [phone]   Fax:  [phone]  Physical Therapy Evaluation  Patient Details  Name: Karen Davis MRN: [id number] Date of Birth: June 09, 1970 Referring Provider: Pieter Partridge, DO  Encounter Date: 12/30/2016      PT End of Session - 12/30/16 0853    Visit Number 1   Number of Visits 13   Date for PT Re-Evaluation 02/13/17   Authorization Type BC[employer]   PT Start Time 0849   PT Stop Time 0943   PT Time Calculation (min) 54 min   Activity Tolerance Patient tolerated treatment well   Behavior During Therapy [facility] for tasks assessed/performed      Past Medical History:  Diagnosis Date  . Anemia   . Migraines     Past Surgical History:  Procedure Laterality Date  . FINGER SURGERY Right    pinkie   . KNEE SURGERY Right    x2  . PILONIDAL CYST EXCISION    . TEAR DUCT PROBING Left     There were no vitals filed for this visit.       Subjective Assessment - 12/30/16 0853    Subjective Back pain started about 2 years ago. feels like there is a knot where an ice pick is stabbing, insidious onset/resolutions. Very stiff at night/getting out of bed in the morning. Has to pull knees to chest to stretch before being able to stretch out. Was here for one visit and had traction which completely resolved pain until this spring and pain returned.    How long can you sit comfortably? 45 min-1 hour   How long can you stand comfortably? I just have to keep moving   Currently in Pain? Yes   Pain Score 7    Pain Location Back   Pain Orientation Right;Lower   Pain Descriptors / Indicators Stabbing   Aggravating Factors  sitting/being still   Pain Relieving Factors move around, lay down            [facility] PT Assessment - 12/30/16 0001      Assessment   Medical Diagnosis LBP   Referring Provider Pieter Partridge, DO   Onset Date/Surgical Date --  2 yr ago   Hand  Dominance Right   Next MD Visit 03/05/17   Prior Therapy yes     Precautions   Precautions None     Restrictions   Weight Bearing Restrictions No     Balance Screen   Has the patient fallen in the past 6 months No     [place] residence     Prior Function   Vocation Full time employment   Psychologist, clinical, [employer]     Cognition   Overall Cognitive Status Within Functional Limits for tasks assessed     Observation/Other Assessments   Focus on Therapeutic Outcomes (FOTO)  53% limitation     Sensation   Additional Comments occasional N/T into legs            Objective measurements completed on examination: See above findings.          [facility] Adult PT Treatment/Exercise - 12/30/16 0001      Modalities   Modalities Traction;Moist Heat     Moist Heat Therapy   Number Minutes Moist Heat 10 Minutes   Moist Heat Location Lumbar Spine     Traction   Type of Traction Lumbar  Min (lbs) 40   Max (lbs) 55   Hold Time 20   Rest Time 15   Time 17 min                PT Education - 12/30/16 0925    Education provided Yes   Education Details anatomy of condtion, POC, traction, importance of abdominal strengthening, FOTO   Person(s) Educated Patient   Methods Explanation   Comprehension Verbalized understanding;Need further instruction             PT Long Term Goals - 12/30/16 0915      PT LONG TERM GOAL #1   Title Pt will be able to sit at work to complete activities, utilizing proper posture and standing in appropraite time intervals to decrease limitations by back pain   Baseline will educate on proper posture   Time 6   Period Weeks   Status New   Target Date 02/13/17     PT LONG TERM GOAL #2   Title FOTO to 39% limitation to indicate significant improvement in functional ability   Baseline 53% limitation at eval   Time 6   Period Weeks   Status New   Target Date  02/13/17     PT LONG TERM GOAL #3   Title Pt will demo gross 5/5 strength in lumbopelvic region to indicate proper suppport to biomechanical chain   Baseline NT at eval due to migraine, recent hysterectomy weakened core   Time 6   Period Weeks   Status New   Target Date 02/13/17     PT LONG TERM GOAL #4   Title Pt will be independent in HEP to continue strengthening and provide long term care   Baseline will progress as appropriate   Time 6   Period Weeks   Status New   Target Date 02/13/17     PT LONG TERM GOAL #5   Title .                Plan - 12/30/16 0913    Clinical Impression Statement Pt presents to PT with complaints of R-sided LBP that has come and gone over the last 2 years. Pt with migraine today. Due to knowledge that traction was helpful in last round of PT, we placed her on traction today and I encouraged her to return for exercises to stabilize abdominal wall for support to lumbar spine. Pt will benefit from skilled PT in order to decrease lumbar pain and improve lumbopelvic stability to provide support during daily activities   Clinical Presentation Stable   Clinical Decision Making Low   Rehab Potential Good   PT Frequency 2x / week   PT Duration 6 weeks   PT Treatment/Interventions ADLs/Self Care Home Management;Cryotherapy;Electrical Stimulation;Iontophoresis 4mg /ml Dexamethasone;Functional mobility training;Stair training;Gait training;Ultrasound;Traction;Moist Heat;Therapeutic activities;Therapeutic exercise;Balance training;Neuromuscular re-education;Patient/family education;Passive range of motion;Manual techniques;Dry needling;Taping   PT Next Visit Plan how did traction feel? lumbopelvic stability   Consulted and Agree with Plan of Care Patient      Patient will benefit from skilled therapeutic intervention in order to improve the following deficits and impairments:  Pain, Decreased strength, Decreased activity tolerance, Postural dysfunction,  Impaired flexibility, Improper body mechanics  Visit Diagnosis: Chronic right-sided low back pain without sciatica - Plan: PT plan of care cert/re-cert     Problem List There are no active problems to display for this patient.   Ivelis Norgard C. Hamsa Laurich PT, DPT 12/30/16 10:21 AM   [facility] [address]  [place], [place], [address] Phone: [phone]   Fax:  [phone]  Name: ANNIEBELL BEDORE MRN: [id number] Date of Birth: [birth year]

## 2017-01-01 ENCOUNTER — Ambulatory Visit: Payer: BLUE CROSS/BLUE SHIELD | Admitting: Physical Therapy

## 2017-01-01 ENCOUNTER — Ambulatory Visit: Payer: BLUE CROSS/BLUE SHIELD | Attending: Neurology | Admitting: Physical Therapy

## 2017-01-01 ENCOUNTER — Encounter: Payer: Self-pay | Admitting: Physical Therapy

## 2017-01-01 DIAGNOSIS — M6281 Muscle weakness (generalized): Secondary | ICD-10-CM | POA: Diagnosis present

## 2017-01-01 DIAGNOSIS — G8929 Other chronic pain: Secondary | ICD-10-CM | POA: Insufficient documentation

## 2017-01-01 DIAGNOSIS — M545 Low back pain: Secondary | ICD-10-CM | POA: Insufficient documentation

## 2017-01-01 DIAGNOSIS — R2689 Other abnormalities of gait and mobility: Secondary | ICD-10-CM | POA: Insufficient documentation

## 2017-01-01 NOTE — Therapy (Signed)
[place], [place], [address] Phone: [phone]   Fax:  [phone]  Physical Therapy Treatment  Patient Details  Name: Karen Davis MRN: [id number] Date of Birth: 1970/08/18 Referring Provider: Pieter Partridge, DO  Encounter Date: 01/01/2017      PT End of Session - 01/01/17 1622    Visit Number 2   Number of Visits 13   Date for PT Re-Evaluation 02/13/17   PT Start Time 0347   PT Stop Time 1639   PT Time Calculation (min) 53 min   Activity Tolerance Patient tolerated treatment well   Behavior During Therapy [facility] for tasks assessed/performed      Past Medical History:  Diagnosis Date  . Anemia   . Migraines     Past Surgical History:  Procedure Laterality Date  . FINGER SURGERY Right    pinkie   . KNEE SURGERY Right    x2  . PILONIDAL CYST EXCISION    . TEAR DUCT PROBING Left     There were no vitals filed for this visit.      Subjective Assessment - 01/01/17 1551    Subjective "things are going well, not really having pain today"   Currently in Pain? Yes   Pain Score 0-No pain  4/25 today at certain moments   Pain Location Back   Pain Orientation Right;Lower   Pain Descriptors / Indicators Aching   Pain Type Chronic pain   Pain Onset More than a month ago                         [facility] Adult PT Treatment/Exercise - 01/01/17 1610      Lumbar Exercises: Seated   Other Seated Lumbar Exercises pelvic tilt 2 x 10 seated on dyna disc     Lumbar Exercises: Supine   Dead Bug --  4 x 10    Other Supine Lumbar Exercises adduction isometric strengthening 1 x 10 holding 10 sec   Other Supine Lumbar Exercises modified crunch reaching for heels focusing on keeping chin in good position     Traction   Type of Traction Lumbar   Min (lbs) 40   Max (lbs) 55   Hold Time 20   Rest Time 15   Time 15     Manual Therapy   Manual Therapy Muscle Energy Technique   Manual therapy comments manual trigger point release over the glute medius on the R   Muscle Energy Technique MET for R hip adduction                     PT Long Term Goals - 12/30/16 0915      PT LONG TERM GOAL #1   Title Pt will be able to sit at work to complete activities, utilizing proper posture and standing in appropraite time intervals to decrease limitations by back pain   Baseline will educate on proper posture   Time 6   Period Weeks   Status New   Target Date 02/13/17     PT LONG TERM GOAL #2   Title FOTO to 39% limitation to indicate significant improvement in functional ability   Baseline 53% limitation at eval   Time 6   Period Weeks   Status New   Target Date 02/13/17     PT LONG TERM GOAL #3   Title Pt will demo gross 5/5 strength in lumbopelvic region to  indicate proper suppport to biomechanical chain   Baseline NT at eval due to migraine, recent hysterectomy weakened core   Time 6   Period Weeks   Status New   Target Date 02/13/17     PT LONG TERM GOAL #4   Title Pt will be independent in HEP to continue strengthening and provide long term care   Baseline will progress as appropriate   Time 6   Period Weeks   Status New   Target Date 02/13/17     PT LONG TERM GOAL #5   Title .               Plan - 01/01/17 1726    Clinical Impression Statement pt reports intermittent soreness in the R low back. pt demonstrates possible Inflare of the r innominate following hip abductor stretching and isometric adduction she reported no pain. core strengtheing peformed and conitnued mechanical traction which she report decreased no pain post session.    PT Treatment/Interventions ADLs/Self Care Home Management;Cryotherapy;Electrical Stimulation;Iontophoresis 49m/ml Dexamethasone;Functional mobility training;Stair training;Gait training;Ultrasound;Traction;Moist Heat;Therapeutic activities;Therapeutic exercise;Balance training;Neuromuscular  re-education;Patient/family education;Passive range of motion;Manual techniques;Dry needling;Taping   PT Next Visit Plan how did traction feel? innominate inflare on the R, core strengthening    PT Home Exercise Plan ITB stretching, hip isometric adduction, seated pelvic tilt   Consulted and Agree with Plan of Care Patient      Patient will benefit from skilled therapeutic intervention in order to improve the following deficits and impairments:  Pain, Decreased strength, Decreased activity tolerance, Postural dysfunction, Impaired flexibility, Improper body mechanics  Visit Diagnosis: Chronic right-sided low back pain without sciatica  Muscle weakness (generalized)  Other abnormalities of gait and mobility     Problem List There are no active problems to display for this patient.  KStarr LakePT, DPT, LAT, ATC  01/01/17  5:35 PM      [facility] [address]Phone: [phone]  Fax:  [phone] Name: Karen FENLEYMRN: [id number] of Birth: [birth year]

## 2017-01-06 ENCOUNTER — Ambulatory Visit: Payer: BLUE CROSS/BLUE SHIELD | Admitting: Physical Therapy

## 2017-01-06 ENCOUNTER — Encounter: Payer: Self-pay | Admitting: Physical Therapy

## 2017-01-06 DIAGNOSIS — M545 Low back pain: Secondary | ICD-10-CM | POA: Diagnosis not present

## 2017-01-06 DIAGNOSIS — M6281 Muscle weakness (generalized): Secondary | ICD-10-CM

## 2017-01-06 DIAGNOSIS — G8929 Other chronic pain: Secondary | ICD-10-CM

## 2017-01-06 DIAGNOSIS — R2689 Other abnormalities of gait and mobility: Secondary | ICD-10-CM

## 2017-01-06 NOTE — Therapy (Signed)
[place], [place], [address] Phone: [phone]   Fax:  [phone]  Physical Therapy Treatment  Patient Details  Name: Karen Davis MRN: [id number] Date of Birth: 26-Aug-1970 Referring Provider: Pieter Partridge, DO   Encounter Date: 01/06/2017  PT End of Session - 01/06/17 1016    Visit Number  3    Number of Visits  13    Date for PT Re-Evaluation  02/13/17    Authorization Type  BCBS    PT Start Time  1016    PT Stop Time  1056    PT Time Calculation (min)  40 min    Activity Tolerance  Patient tolerated treatment well    Behavior During Therapy  [facility] for tasks assessed/performed       Past Medical History:  Diagnosis Date  . Anemia   . Migraines     Past Surgical History:  Procedure Laterality Date  . FINGER SURGERY Right    pinkie   . KNEE SURGERY Right    x2  . PILONIDAL CYST EXCISION    . TEAR DUCT PROBING Left     There were no vitals filed for this visit.  Subjective Assessment - 01/06/17 1016    Subjective  still had some ache after last session but felt good until last night, did a little more lifting at work.     Currently in Pain?  Yes    Pain Score  7     Pain Location  Back    Pain Orientation  Lower;Mid    Pain Descriptors / Indicators  Stabbing    Aggravating Factors   lifting/moving                      [facility] Adult PT Treatment/Exercise - 01/06/17 0001      Exercises   Exercises  Knee/Hip      Lumbar Exercises: Seated   Long Arc Quad on [place]  Both;15 reps    Hip Flexion on [place]  Both;15 reps    Other Seated Lumbar Exercises  ball squeeze with pelvic tilt    Other Seated Lumbar Exercises  physioball bouncing      Lumbar Exercises: Supine   Ab Set  Other (comment) pelvic tilt with ball squeeze   pelvic tilt with ball squeeze   Large Ball Abdominal Isometric  10 reps;3 seconds    Other Supine Lumbar Exercises  ball bw knees, lift to table top     Other Supine Lumbar Exercises  hundreds, feet on table      Knee/Hip Exercises: Sidelying   Hip ADduction  Right;20 reps      Manual Therapy   Manual Therapy  Soft tissue mobilization    Manual therapy comments  educated in trigger point release with tennis ball    Soft tissue mobilization  R glut med/min/max, piriformis                  PT Long Term Goals - 12/30/16 0915      PT LONG TERM GOAL #1   Title  Pt will be able to sit at work to complete activities, utilizing proper posture and standing in appropraite time intervals to decrease limitations by back pain    Baseline  will educate on proper posture    Time  6    Period  Weeks    Status  New    Target Date  02/13/17  PT LONG TERM GOAL #2   Title  FOTO to 39% limitation to indicate significant improvement in functional ability    Baseline  53% limitation at eval    Time  6    Period  Weeks    Status  New    Target Date  02/13/17      PT LONG TERM GOAL #3   Title  Pt will demo gross 5/5 strength in lumbopelvic region to indicate proper suppport to biomechanical chain    Baseline  NT at eval due to migraine, recent hysterectomy weakened core    Time  6    Period  Weeks    Status  New    Target Date  02/13/17      PT LONG TERM GOAL #4   Title  Pt will be independent in HEP to continue strengthening and provide long term care    Baseline  will progress as appropriate    Time  6    Period  Weeks    Status  New    Target Date  02/13/17      PT LONG TERM GOAL #5   Title  .            Plan - 01/06/17 1056    Clinical Impression Statement  Heavy VC required for abdominal contractions today. held on traction due to most pain being at [facility]. Will continue to benefit from core strengthening and R adductor strengthening/abductor stretching.     PT Treatment/Interventions  Cryotherapy;Electrical Stimulation;Iontophoresis 4mg /ml Dexamethasone;Functional mobility training;Stair training;Gait  training;Ultrasound;Traction;Moist Heat;Therapeutic activities;Therapeutic exercise;Balance training;Neuromuscular re-education;Patient/family education;Passive range of motion;Manual techniques;Dry needling;Taping;ADLs/Self Care Home Management    PT Next Visit Plan  change in pain without traction?, core strengthening, squat/lift technique    PT Home Exercise Plan  ITB stretching, hip isometric adduction, seated pelvic tilt; hundreds, tennis ball trigger point release    Consulted and Agree with Plan of Care  Patient       Patient will benefit from skilled therapeutic intervention in order to improve the following deficits and impairments:  Pain, Decreased strength, Decreased activity tolerance, Postural dysfunction, Impaired flexibility, Improper body mechanics  Visit Diagnosis: Chronic right-sided low back pain without sciatica  Muscle weakness (generalized)  Other abnormalities of gait and mobility     Problem List There are no active problems to display for this patient.  Timmothy Baranowski C. Romina Divirgilio PT, DPT 01/06/17 10:59 AM   [facility] [address] [place], [place], [address] Phone: [phone]   Fax:  [phone]  Name: Karen Davis MRN: [id number] Date of Birth: 24-Mar-1970

## 2017-01-08 ENCOUNTER — Encounter: Payer: Self-pay | Admitting: Physical Therapy

## 2017-01-08 ENCOUNTER — Ambulatory Visit: Payer: BLUE CROSS/BLUE SHIELD | Admitting: Physical Therapy

## 2017-01-08 DIAGNOSIS — M545 Low back pain, unspecified: Secondary | ICD-10-CM

## 2017-01-08 DIAGNOSIS — R2689 Other abnormalities of gait and mobility: Secondary | ICD-10-CM

## 2017-01-08 DIAGNOSIS — G8929 Other chronic pain: Secondary | ICD-10-CM

## 2017-01-08 DIAGNOSIS — M6281 Muscle weakness (generalized): Secondary | ICD-10-CM

## 2017-01-08 NOTE — Therapy (Signed)
[place], [place], [address] Phone: [phone]   Fax:  [phone]  Physical Therapy Treatment  Patient Details  Name: Karen Davis MRN: [id number] Date of Birth: Jul 28, 1970 Referring Provider: Pieter Partridge, DO   Encounter Date: 01/08/2017  PT End of Session - 01/08/17 0724    Visit Number  4    Number of Visits  13    Date for PT Re-Evaluation  02/13/17    Authorization Type  BCBS    PT Start Time  0715    PT Stop Time  0815    PT Time Calculation (min)  60 min       Past Medical History:  Diagnosis Date  . Anemia   . Migraines     Past Surgical History:  Procedure Laterality Date  . FINGER SURGERY Right    pinkie   . KNEE SURGERY Right    x2  . PILONIDAL CYST EXCISION    . TEAR DUCT PROBING Left     There were no vitals filed for this visit.  Subjective Assessment - 01/08/17 0717    Subjective  Still a stabbing feeling     Currently in Pain?  Yes    Pain Score  7     Pain Location  Back    Pain Orientation  Lower;Mid;Right    Pain Descriptors / Indicators  Stabbing                      [facility] Adult PT Treatment/Exercise - 01/08/17 0001      Therapeutic Activites    Therapeutic Activities  Lifting    Lifting  lift from floor to waist       Lumbar Exercises: Aerobic   Stationary Bike  Nustep L 5 UE/LE x 5 minutes       Lumbar Exercises: Supine   Heel Slides  10 reps    Bent Knee Raise Limitations  Scissors level 2     Bridge  10 reps    Other Supine Lumbar Exercises  adduction isometric strengthening 1 x 10 holding 10 sec      Lumbar Exercises: Prone   Other Prone Lumbar Exercises  Tra contract with hip extension (painful on right ) then hamstring curls single and bilateral, donkey kicks x 10 each, extension x 10 each, heel squeeze with ball, glute squeeze x 10, IR isometric into manual resist all without pain       Knee/Hip Exercises: Supine   Bridges  with Ball Squeeze  10 reps      Knee/Hip Exercises: Sidelying   Hip ABduction  20 reps both   both   Hip ADduction  Right;20 reps    Clams  15 each side      Modalities   Modalities  Electrical Stimulation      Moist Heat Therapy   Number Minutes Moist Heat  15 Minutes    Moist Heat Location  Lumbar Spine prone with pillow    prone with pillow      Electrical Stimulation   Electrical Stimulation Location  Lumbar    Electrical Stimulation Action  IFC x 15 minutes    Electrical Stimulation Parameters  14 ma     Electrical Stimulation Goals  Pain      Manual Therapy   Soft tissue mobilization  R glut med/min/max, piriformis  PT Long Term Goals - 12/30/16 0915      PT LONG TERM GOAL #1   Title  Pt will be able to sit at work to complete activities, utilizing proper posture and standing in appropraite time intervals to decrease limitations by back pain    Baseline  will educate on proper posture    Time  6    Period  Weeks    Status  New    Target Date  02/13/17      PT LONG TERM GOAL #2   Title  FOTO to 39% limitation to indicate significant improvement in functional ability    Baseline  53% limitation at eval    Time  6    Period  Weeks    Status  New    Target Date  02/13/17      PT LONG TERM GOAL #3   Title  Pt will demo gross 5/5 strength in lumbopelvic region to indicate proper suppport to biomechanical chain    Baseline  NT at eval due to migraine, recent hysterectomy weakened core    Time  6    Period  Weeks    Status  New    Target Date  02/13/17      PT LONG TERM GOAL #4   Title  Pt will be independent in HEP to continue strengthening and provide long term care    Baseline  will progress as appropriate    Time  6    Period  Weeks    Status  New    Target Date  02/13/17      PT LONG TERM GOAL #5   Title  .            Plan - 01/08/17 0806    Clinical Impression Statement  Pt reports no change. Constant 7/10 pain  with 10/10 stabbing pain during transitions. After manual in prone, worked on stabilization in prone and pt did well with this positions. Once in hooklying pt felt pain with most exercises. Trial of IFC today in prone with pillow. Pt returned demo of squat technique with cues for neutral spine.     PT Next Visit Plan  change in pain without traction?, core strengthening, squat/lift technique, assess tolerance to prone stabilization  this visit.     PT Home Exercise Plan  ITB stretching, hip isometric adduction, seated pelvic tilt; hundreds, tennis ball trigger point release    Consulted and Agree with Plan of Care  Patient       Patient will benefit from skilled therapeutic intervention in order to improve the following deficits and impairments:  Pain, Decreased strength, Decreased activity tolerance, Postural dysfunction, Impaired flexibility, Improper body mechanics  Visit Diagnosis: Chronic right-sided low back pain without sciatica  Muscle weakness (generalized)  Other abnormalities of gait and mobility     Problem List There are no active problems to display for this patient.   Dorene Ar, [place] 01/08/2017, 8:11 AM  [place], [place], [address] Phone: [phone]   Fax:  [phone]  Name: Karen Davis MRN: [id number] Date of Birth: [birth year]

## 2017-01-13 ENCOUNTER — Ambulatory Visit: Payer: BLUE CROSS/BLUE SHIELD | Admitting: Physical Therapy

## 2017-01-13 ENCOUNTER — Encounter: Payer: Self-pay | Admitting: Physical Therapy

## 2017-01-13 DIAGNOSIS — R2689 Other abnormalities of gait and mobility: Secondary | ICD-10-CM

## 2017-01-13 DIAGNOSIS — M545 Low back pain: Secondary | ICD-10-CM | POA: Diagnosis not present

## 2017-01-13 DIAGNOSIS — M6281 Muscle weakness (generalized): Secondary | ICD-10-CM

## 2017-01-13 DIAGNOSIS — G8929 Other chronic pain: Secondary | ICD-10-CM

## 2017-01-13 NOTE — Therapy (Signed)
[place], [place], [address] Phone: [phone]   Fax:  [phone]  Physical Therapy Treatment  Patient Details  Name: Karen Davis MRN: [id number] Date of Birth: 1970-10-07 Referring Provider: Pieter Partridge, DO   Encounter Date: 01/13/2017  PT End of Session - 01/13/17 0926    Visit Number  5    Number of Visits  13    Date for PT Re-Evaluation  02/13/17    Authorization Type  BCBS    PT Start Time  0845    PT Stop Time  0930    PT Time Calculation (min)  45 min       Past Medical History:  Diagnosis Date  . Anemia   . Migraines     Past Surgical History:  Procedure Laterality Date  . FINGER SURGERY Right    pinkie   . KNEE SURGERY Right    x2  . PILONIDAL CYST EXCISION    . TEAR DUCT PROBING Left     There were no vitals filed for this visit.  Subjective Assessment - 01/13/17 0849    Subjective  It hasn't been as painful. Ive been tightening my core and working on posture.     Currently in Pain?  Yes    Pain Score  1     Pain Location  Back    Pain Orientation  Lower;Left    Pain Descriptors / Indicators  Dull    Aggravating Factors   transitions.     Pain Relieving Factors  move around, lay down                       [facility] Adult PT Treatment/Exercise - 01/13/17 0001      Lumbar Exercises: Supine   Clam  10 reps with ab brace     Heel Slides  --    Other Supine Lumbar Exercises  adduction isometric strengthening 1 x 10 holding 10 sec    Other Supine Lumbar Exercises  hundreds, feet on table, table top       Lumbar Exercises: Prone   Other Prone Lumbar Exercises  Tra contract with hip extension pillow under abdominals  then hamstring curls single and bilateral, donkey kicks x 10 each, extension x 10 each, heel squeeze with ball, glute squeeze x 10, IR isometric into manual resist all without pain       Lumbar Exercises: Quadruped   [place]  10  reps    Single Arm Raise  10 reps cues for neutral and core     Straight Leg Raise  10 reps    Straight Leg Raises Limitations  cues for neutral and core     Opposite Arm/Leg Raise  10 reps    Opposite Arm/Leg Raise Limitations  cues for neutral spine/ abdominal draw in       Knee/Hip Exercises: Seated   Sit to Sand  10 reps with isometric ER gren band       Knee/Hip Exercises: Sidelying   Clams  15 each side                  PT Long Term Goals - 12/30/16 0915      PT LONG TERM GOAL #1   Title  Pt will be able to sit at work to complete activities, utilizing proper posture and standing in appropraite time intervals to decrease limitations by back pain    Baseline  will educate on proper posture    Time  6    Period  Weeks    Status  New    Target Date  02/13/17      PT LONG TERM GOAL #2   Title  FOTO to 39% limitation to indicate significant improvement in functional ability    Baseline  53% limitation at eval    Time  6    Period  Weeks    Status  New    Target Date  02/13/17      PT LONG TERM GOAL #3   Title  Pt will demo gross 5/5 strength in lumbopelvic region to indicate proper suppport to biomechanical chain    Baseline  NT at eval due to migraine, recent hysterectomy weakened core    Time  6    Period  Weeks    Status  New    Target Date  02/13/17      PT LONG TERM GOAL #4   Title  Pt will be independent in HEP to continue strengthening and provide long term care    Baseline  will progress as appropriate    Time  6    Period  Weeks    Status  New    Target Date  02/13/17      PT LONG TERM GOAL #5   Title  .            Plan - 01/13/17 0941    Clinical Impression Statement  Pt arrrives with 0/10 pain and intermittent 1/10 pain during transitions. She reports being more conscious of daily abdominal engagement and posture. Continued with core strength in prone quadruped and supine. Rewviewed Hundreds with pt having pain in neck, easier with  keeping head down. Pt has only tried Level 1 hundreds at home. Tried Level 2 LE position with small head lift. She is more comfortable in supine/ hooklying positions today than she was last visit. Declined modalities and reports no increased pain at end of session. Progressing toward goals,     PT Next Visit Plan  core strengthening, squat/lift technique    PT Home Exercise Plan  ITB stretching, hip isometric adduction, seated pelvic tilt; hundreds, tennis ball trigger point release    Consulted and Agree with Plan of Care  Patient       Patient will benefit from skilled therapeutic intervention in order to improve the following deficits and impairments:  Pain, Decreased strength, Decreased activity tolerance, Postural dysfunction, Impaired flexibility, Improper body mechanics  Visit Diagnosis: Chronic right-sided low back pain without sciatica  Muscle weakness (generalized)  Other abnormalities of gait and mobility     Problem List There are no active problems to display for this patient.   Dorene Ar, [place] 01/13/2017, 9:59 AM  [facility] [address] [place], [place], [address] Phone: [phone]   Fax:  [phone]  Name: Karen Davis MRN: [id number] Date of Birth: [birth year]

## 2017-01-15 ENCOUNTER — Ambulatory Visit: Payer: BLUE CROSS/BLUE SHIELD | Admitting: Physical Therapy

## 2017-01-15 ENCOUNTER — Encounter: Payer: Self-pay | Admitting: Physical Therapy

## 2017-01-15 DIAGNOSIS — M6281 Muscle weakness (generalized): Secondary | ICD-10-CM

## 2017-01-15 DIAGNOSIS — M545 Low back pain, unspecified: Secondary | ICD-10-CM

## 2017-01-15 DIAGNOSIS — G8929 Other chronic pain: Secondary | ICD-10-CM

## 2017-01-15 DIAGNOSIS — R2689 Other abnormalities of gait and mobility: Secondary | ICD-10-CM

## 2017-01-15 NOTE — Therapy (Signed)
[place], [place], [address] Phone: [phone]   Fax:  [phone]  Physical Therapy Treatment  Patient Details  Name: Karen Davis MRN: [id number] Date of Birth: Jan 07, 1971 Referring Provider: Pieter Partridge, DO   Encounter Date: 01/15/2017  PT End of Session - 01/15/17 0718    Visit Number  6    Number of Visits  13    Date for PT Re-Evaluation  02/13/17    Authorization Type  BCBS    PT Start Time  0715    PT Stop Time  0805    PT Time Calculation (min)  50 min       Past Medical History:  Diagnosis Date  . Anemia   . Migraines     Past Surgical History:  Procedure Laterality Date  . FINGER SURGERY Right    pinkie   . KNEE SURGERY Right    x2  . PILONIDAL CYST EXCISION    . TEAR DUCT PROBING Left     There were no vitals filed for this visit.  Subjective Assessment - 01/15/17 0718    Currently in Pain?  Yes    Pain Score  2     Pain Orientation  Mid    Pain Descriptors / Indicators  Dull    Aggravating Factors   sitting, transitions     Pain Relieving Factors  change positions                       [facility] Adult PT Treatment/Exercise - 01/15/17 0001      Lumbar Exercises: Aerobic   Stationary Bike  Nustep L 5 UE/LE x 2 minutes       Lumbar Exercises: Supine   Other Supine Lumbar Exercises  adduction isometric strengthening 1 x 10 holding 10 sec      Lumbar Exercises: Prone   Other Prone Lumbar Exercises  Tra contract with hip extension pillow under abdominals  then hamstring curls single and bilateral, donkey kicks x 10 each, extension x 10 each, heel squeeze with ball, glute squeeze x 10, IR isometric into manual resist all without pain       Lumbar Exercises: Quadruped   Madcat/Old Horse  10 reps    Opposite Arm/Leg Raise  10 reps    Opposite Arm/Leg Raise Limitations  cues for neutral spine/ abdominal draw in       Knee/Hip Exercises: Sidelying   Hip  ABduction  20 reps both    Hip ADduction  Right;20 reps;Left pain with left adduction       Modalities   Modalities  Iontophoresis      Iontophoresis   Type of Iontophoresis  Dexamethasone    Location  R lower lumbar     Dose  1 ml     Time  6 hour patch      Traction   Min (lbs)  35    Max (lbs)  50    Hold Time  20    Rest Time  10    Time  10                  PT Long Term Goals - 01/15/17 0720      PT LONG TERM GOAL #1   Title  Pt will be able to sit at work to complete activities, utilizing proper posture and standing in appropraite time intervals to decrease limitations by back pain  Baseline  45 minutes, about the same     Time  6    Period  Weeks    Status  On-going      PT LONG TERM GOAL #2   Title  FOTO to 39% limitation to indicate significant improvement in functional ability    Baseline  53% limitation at eval    Time  6    Period  Weeks    Status  Unable to assess      PT LONG TERM GOAL #3   Title  Pt will demo gross 5/5 strength in lumbopelvic region to indicate proper suppport to biomechanical chain    Time  6    Period  Weeks    Status  On-going      PT LONG TERM GOAL #4   Title  Pt will be independent in HEP to continue strengthening and provide long term care    Time  6    Period  Weeks    Status  On-going            Plan - 01/15/17 0748    Clinical Impression Statement  Pt reports pain in central low back is a constant 2/10 and increases with prolonged sitting. Right side more painful during transitional movements. Today pain increased after hooklying exercises to 4/10 due to pressure placed on lumbar spine. Repeated traction as this has helped in the past. Unable to tolerated hooklying so performed traction in prone which felt much better per pt.  Also, began trial of iontophoresis to Right low back. Pt reports decreased pain and looser after traction.     PT Next Visit Plan  assess prone traction and ionto; core  strengthening, squat/lift technique    PT Home Exercise Plan  ITB stretching, hip isometric adduction, seated pelvic tilt; hundreds, tennis ball trigger point release    Consulted and Agree with Plan of Care  Patient       Patient will benefit from skilled therapeutic intervention in order to improve the following deficits and impairments:  Pain, Decreased strength, Decreased activity tolerance, Postural dysfunction, Impaired flexibility, Improper body mechanics  Visit Diagnosis: Chronic right-sided low back pain without sciatica  Muscle weakness (generalized)  Other abnormalities of gait and mobility     Problem List There are no active problems to display for this patient.   Dorene Ar, [place] 01/15/2017, 8:34 AM  [place], [place], [address] Phone: [phone]   Fax:  [phone]  Name: Karen Davis MRN: [id number] Date of Birth: [birth year]

## 2017-01-19 ENCOUNTER — Encounter: Payer: Self-pay | Admitting: Physical Therapy

## 2017-01-19 ENCOUNTER — Ambulatory Visit: Payer: BLUE CROSS/BLUE SHIELD | Admitting: Physical Therapy

## 2017-01-19 DIAGNOSIS — M545 Low back pain, unspecified: Secondary | ICD-10-CM

## 2017-01-19 DIAGNOSIS — G8929 Other chronic pain: Secondary | ICD-10-CM

## 2017-01-19 DIAGNOSIS — R2689 Other abnormalities of gait and mobility: Secondary | ICD-10-CM

## 2017-01-19 DIAGNOSIS — M6281 Muscle weakness (generalized): Secondary | ICD-10-CM

## 2017-01-19 NOTE — Therapy (Signed)
[place], [place], [address] Phone: [phone]   Fax:  [phone]  Physical Therapy Treatment  Patient Details  Name: Karen Davis MRN: [id number] Date of Birth: 11/03/1970 Referring Provider: Pieter Partridge, DO   Encounter Date: 01/19/2017  PT End of Session - 01/19/17 0718    Visit Number  7    Number of Visits  13    Date for PT Re-Evaluation  02/13/17    Authorization Type  BCBS    PT Start Time  0715    PT Stop Time  0806    PT Time Calculation (min)  51 min       Past Medical History:  Diagnosis Date  . Anemia   . Migraines     Past Surgical History:  Procedure Laterality Date  . FINGER SURGERY Right    pinkie   . KNEE SURGERY Right    x2  . PILONIDAL CYST EXCISION    . TEAR DUCT PROBING Left     There were no vitals filed for this visit.  Subjective Assessment - 01/19/17 0717    Subjective  My pain is a constant 3-4/10 with intermittent sharp pain.     Currently in Pain?  Yes    Pain Score  4     Pain Location  Back    Pain Orientation  Mid    Pain Descriptors / Indicators  Aching;Dull    Aggravating Factors   tansitions, sitting     Pain Relieving Factors  change positions                       [facility] Adult PT Treatment/Exercise - 01/19/17 0001      Lumbar Exercises: Aerobic   Stationary Bike  Rec bike L1 x 5 minutes       Lumbar Exercises: Seated   Other Seated Lumbar Exercises  ball squeeze with pelvic tilt      Lumbar Exercises: Prone   Other Prone Lumbar Exercises  Tra contract with hip extension pillow under abdominals  then hamstring curls single and bilateral, donkey kicks x 10 each, extension x 10 each, heel squeeze with ball, glute squeeze x 10, IR  yellow band  intermittent right LB pain       Lumbar Exercises: Quadruped   Madcat/Old Horse  10 reps    Opposite Arm/Leg Raise  10 reps pain today on right LB    Opposite Arm/Leg Raise  Limitations  cues for neutral spine/ abdominal draw in       Knee/Hip Exercises: Sidelying   Hip ABduction  --    Hip ADduction  Right;20 reps;Left    Clams  15 each yellow band , reverse AROm x 15 each       Modalities   Modalities  Ultrasound      Ultrasound   Ultrasound Location  Bilateral PSIS     Ultrasound Parameters  100% 1.4 w/cm2 1 mhz x 10  minutes     Ultrasound Goals  Pain      Manual Therapy   Soft tissue mobilization  Trigger point release x 4  right Glute med, max                  PT Long Term Goals - 01/15/17 0720      PT LONG TERM GOAL #1   Title  Pt will be able to sit at work to complete activities, utilizing proper  posture and standing in appropraite time intervals to decrease limitations by back pain    Baseline  45 minutes, about the same     Time  6    Period  Weeks    Status  On-going      PT LONG TERM GOAL #2   Title  FOTO to 39% limitation to indicate significant improvement in functional ability    Baseline  53% limitation at eval    Time  6    Period  Weeks    Status  Unable to assess      PT LONG TERM GOAL #3   Title  Pt will demo gross 5/5 strength in lumbopelvic region to indicate proper suppport to biomechanical chain    Time  6    Period  Weeks    Status  On-going      PT LONG TERM GOAL #4   Title  Pt will be independent in HEP to continue strengthening and provide long term care    Time  6    Period  Weeks    Status  On-going            Plan - 01/19/17 0814    Clinical Impression Statement  Pt reports pain at 3-4/10 mid low back with sharp pains on right with certain motions and transitons. She is using tennis ball at home for self massage/release with benefit. She continues with tenderness in right gluteals and difficulty lying supine/hooklying.  She reports no change with ionto patch.  Performed 100$ [place] to bilateral PSIS area followed by soft tissue work to right gluteals. Trigger point release x 4 right glute/  glute med. Afterward pt reports feeling much better with no pain. Declined traction today. Pt interested in TPDN.     PT Next Visit Plan  TPDN? FOTO, check progress     PT Home Exercise Plan  ITB stretching, hip isometric adduction, seated pelvic tilt; hundreds, tennis ball trigger point release       Patient will benefit from skilled therapeutic intervention in order to improve the following deficits and impairments:  Pain, Decreased strength, Decreased activity tolerance, Postural dysfunction, Impaired flexibility, Improper body mechanics  Visit Diagnosis: Chronic right-sided low back pain without sciatica  Muscle weakness (generalized)  Other abnormalities of gait and mobility     Problem List There are no active problems to display for this patient.   Dorene Ar , [place] 01/19/2017, 8:27 AM  [facility] [address] [place], [place], [address] Phone: [phone]   Fax:  [phone]  Name: Karen Davis MRN: [id number] Date of Birth: April 26, 1970

## 2017-01-20 ENCOUNTER — Encounter: Payer: Self-pay | Admitting: Physical Therapy

## 2017-01-20 ENCOUNTER — Ambulatory Visit: Payer: BLUE CROSS/BLUE SHIELD | Admitting: Physical Therapy

## 2017-01-20 DIAGNOSIS — M6281 Muscle weakness (generalized): Secondary | ICD-10-CM

## 2017-01-20 DIAGNOSIS — R2689 Other abnormalities of gait and mobility: Secondary | ICD-10-CM

## 2017-01-20 DIAGNOSIS — M545 Low back pain, unspecified: Secondary | ICD-10-CM

## 2017-01-20 DIAGNOSIS — G8929 Other chronic pain: Secondary | ICD-10-CM

## 2017-01-20 NOTE — Therapy (Signed)
[place], [place], [address] Phone: [phone]   Fax:  [phone]  Physical Therapy Treatment  Patient Details  Name: Karen Davis MRN: [id number] Date of Birth: [birth year] Referring Provider: Pieter Partridge, DO   Encounter Date: 01/20/2017  PT End of Session - 01/20/17 0921    Visit Number  8    Number of Visits  13    Date for PT Re-Evaluation  02/13/17    Authorization Type  BCBS    PT Start Time  0925    PT Stop Time  1022    PT Time Calculation (min)  57 min    Activity Tolerance  Patient tolerated treatment well    Behavior During Therapy  [facility] for tasks assessed/performed       Past Medical History:  Diagnosis Date  . Anemia   . Migraines     Past Surgical History:  Procedure Laterality Date  . FINGER SURGERY Right    pinkie   . KNEE SURGERY Right    x2  . PILONIDAL CYST EXCISION    . TEAR DUCT PROBING Left     There were no vitals filed for this visit.  Subjective Assessment - 01/20/17 0925    Subjective  Pain is 5-6/10 today. Had full resolution of symptoms after treatment but felt pain after- sore muscle feeling, felt a pinching in groin.     Currently in Pain?  Yes    Pain Score  6                       [facility] Adult PT Treatment/Exercise - 01/20/17 0001      Knee/Hip Exercises: Stretches   Passive Hamstring Stretch  Other (comment) seated EOB    Other Knee/Hip Stretches  firgure 4      Knee/Hip Exercises: Aerobic   Stationary Bike  5 min L2      Knee/Hip Exercises: Supine   Other Supine Knee/Hip Exercises  pelvic tilt with pillow squeeze      Moist Heat Therapy   Number Minutes Moist Heat  10 Minutes    Moist Heat Location  Lumbar Spine      Manual Therapy   Manual Therapy  Joint mobilization    Joint Mobilization  Lt fem-acetabularAP, long axis distraction    Soft tissue mobilization  area around Rt SIJ    Muscle Energy Technique  L innom  post/R and       Trigger Point Dry Needling - 01/20/17 0949    Consent Given?  Yes    Education Handout Provided  -- verbal education    Muscles Treated Lower Body  Gluteus maximus           PT Education - 01/20/17 1014    Education provided  Yes    Education Details  TPDN & expected outcomes, [place] for manual treatment    Person(s) Educated  Patient    Methods  Explanation    Comprehension  Verbalized understanding          PT Long Term Goals - 01/15/17 0720      PT LONG TERM GOAL #1   Title  Pt will be able to sit at work to complete activities, utilizing proper posture and standing in appropraite time intervals to decrease limitations by back pain    Baseline  45 minutes, about the same     Time  6    Period  Weeks  Status  On-going      PT LONG TERM GOAL #2   Title  FOTO to 39% limitation to indicate significant improvement in functional ability    Baseline  53% limitation at eval    Time  6    Period  Weeks    Status  Unable to assess      PT LONG TERM GOAL #3   Title  Pt will demo gross 5/5 strength in lumbopelvic region to indicate proper suppport to biomechanical chain    Time  6    Period  Weeks    Status  On-going      PT LONG TERM GOAL #4   Title  Pt will be independent in HEP to continue strengthening and provide long term care    Time  6    Period  Weeks    Status  On-going            Plan - 01/20/17 1012    Clinical Impression Statement  pt reported decrease in Rt hip pain following DN. Notable pelvic rotation was addressed and pt reported feeling discomfort in Lt SIJ. Discussed importance of regular mobility and stretching throughout the day as well as keeping equal WB through LE.     PT Treatment/Interventions  Cryotherapy;Electrical Stimulation;Iontophoresis 4mg /ml Dexamethasone;Functional mobility training;Stair training;Gait training;Ultrasound;Traction;Moist Heat;Therapeutic activities;Therapeutic exercise;Balance  training;Neuromuscular re-education;Patient/family education;Passive range of motion;Manual techniques;Dry needling;Taping;ADLs/Self Care Home Management    PT Next Visit Plan  DN outcomes, FOTO    PT Home Exercise Plan  ITB stretching, hip isometric adduction, seated pelvic tilt; hundreds, tennis ball trigger point release       Patient will benefit from skilled therapeutic intervention in order to improve the following deficits and impairments:  Pain, Decreased strength, Decreased activity tolerance, Postural dysfunction, Impaired flexibility, Improper body mechanics  Visit Diagnosis: Chronic right-sided low back pain without sciatica  Muscle weakness (generalized)  Other abnormalities of gait and mobility     Problem List There are no active problems to display for this patient.   Treyon Wymore C. Kairyn Olmeda PT, DPT 01/20/17 10:17 AM   [place] [facility] [address] [place], [place], [address] Phone: [phone]   Fax:  [phone]  Name: AURA BIBBY MRN: [id number] Date of Birth: Nov 09, 1970

## 2017-01-27 ENCOUNTER — Encounter: Payer: Self-pay | Admitting: Physical Therapy

## 2017-01-27 ENCOUNTER — Ambulatory Visit: Payer: BLUE CROSS/BLUE SHIELD | Admitting: Physical Therapy

## 2017-01-27 DIAGNOSIS — M545 Low back pain: Principal | ICD-10-CM

## 2017-01-27 DIAGNOSIS — M6281 Muscle weakness (generalized): Secondary | ICD-10-CM

## 2017-01-27 DIAGNOSIS — G8929 Other chronic pain: Secondary | ICD-10-CM

## 2017-01-27 NOTE — Therapy (Signed)
[place], [place], [address] Phone: [phone]   Fax:  [phone]  Physical Therapy Treatment  Patient Details  Name: Karen Davis MRN: [id number] Date of Birth: Jul 04, 1970 Referring Provider: Pieter Partridge, DO   Encounter Date: 01/27/2017  PT End of Session - 01/27/17 0847    Visit Number  9    Number of Visits  13    Date for PT Re-Evaluation  02/13/17    Authorization Type  BCBS    PT Start Time  0847    PT Stop Time  0927    PT Time Calculation (min)  40 min    Activity Tolerance  Patient tolerated treatment well    Behavior During Therapy  [facility] for tasks assessed/performed       Past Medical History:  Diagnosis Date  . Anemia   . Migraines     Past Surgical History:  Procedure Laterality Date  . FINGER SURGERY Right    pinkie   . KNEE SURGERY Right    x2  . PILONIDAL CYST EXCISION    . TEAR DUCT PROBING Left     There were no vitals filed for this visit.  Subjective Assessment - 01/27/17 0848    Subjective  Felt DN was wonderful. Feeling sore in thighs and core. Across lower back feels sore/achey. Occasional knife on Rt side when turning.     Currently in Pain?  Yes    Pain Score  4     Pain Location  Back    Pain Orientation  Lower    Pain Descriptors / Indicators  Aching         [facility] PT Assessment - 01/27/17 0001      Observation/Other Assessments   Focus on Therapeutic Outcomes (FOTO)   46% limitation                  [facility] Adult PT Treatment/Exercise - 01/27/17 0001      Lumbar Exercises: Aerobic   Stationary Bike  rec bike L2 5 min      Lumbar Exercises: Seated   Other Seated Lumbar Exercises  physioball seated roll out      Knee/Hip Exercises: Stretches   Passive Hamstring Stretch  Other (comment) with strap, straight & adducted    Gastroc Stretch  30 seconds slant board      Knee/Hip Exercises: Supine   Bridges  15 reps straight legs over  physioball    Other Supine Knee/Hip Exercises  resisted LTR blue tband    Other Supine Knee/Hip Exercises  HS curl over physioball & curl up      Manual Therapy   Joint Mobilization  SIJ, lumbar PA             PT Education - 01/27/17 0930    Education provided  Yes    Education Details  FOTO, exercise form/rationale    Person(s) Educated  Patient    Methods  Explanation;Demonstration;Verbal cues;Tactile cues    Comprehension  Verbalized understanding;Need further instruction;Returned demonstration;Verbal cues required;Tactile cues required          PT Long Term Goals - 01/15/17 0720      PT LONG TERM GOAL #1   Title  Pt will be able to sit at work to complete activities, utilizing proper posture and standing in appropraite time intervals to decrease limitations by back pain    Baseline  45 minutes, about the same     Time  6    Period  Weeks    Status  On-going      PT LONG TERM GOAL #2   Title  FOTO to 39% limitation to indicate significant improvement in functional ability    Baseline  53% limitation at eval    Time  6    Period  Weeks    Status  Unable to assess      PT LONG TERM GOAL #3   Title  Pt will demo gross 5/5 strength in lumbopelvic region to indicate proper suppport to biomechanical chain    Time  6    Period  Weeks    Status  On-going      PT LONG TERM GOAL #4   Title  Pt will be independent in HEP to continue strengthening and provide long term care    Time  6    Period  Weeks    Status  On-going            Plan - 01/27/17 0919    Clinical Impression Statement  Cont to have pain in R SIJ upon palpation, level pelvis noted. Mobilized SIJs which created some discomfort on L side as expected, will DN next visit as needed. Encouraged her to continue with core stability at home    PT Treatment/Interventions  Cryotherapy;Electrical Stimulation;Iontophoresis 4mg /ml Dexamethasone;Functional mobility training;Stair training;Gait  training;Ultrasound;Traction;Moist Heat;Therapeutic activities;Therapeutic exercise;Balance training;Neuromuscular re-education;Patient/family education;Passive range of motion;Manual techniques;Dry needling;Taping;ADLs/Self Care Home Management    PT Next Visit Plan  DN PRN    PT Home Exercise Plan  ITB stretching, hip isometric adduction, seated pelvic tilt; hundreds, tennis ball trigger point release; physioball planks       Patient will benefit from skilled therapeutic intervention in order to improve the following deficits and impairments:  Pain, Decreased strength, Decreased activity tolerance, Postural dysfunction, Impaired flexibility, Improper body mechanics  Visit Diagnosis: Chronic right-sided low back pain without sciatica  Muscle weakness (generalized)     Problem List There are no active problems to display for this patient.  Davarious Tumbleson C. Rocket Gunderson PT, DPT 01/27/17 9:30 AM   [place], [place], [address] Phone: [phone]   Fax:  [phone]  Name: NARCISSUS DETWILER MRN: [id number] Date of Birth: 1970/03/27

## 2017-01-29 ENCOUNTER — Encounter: Payer: Self-pay | Admitting: Physical Therapy

## 2017-01-29 ENCOUNTER — Encounter: Payer: BLUE CROSS/BLUE SHIELD | Admitting: Physical Therapy

## 2017-01-29 ENCOUNTER — Ambulatory Visit: Payer: BLUE CROSS/BLUE SHIELD | Admitting: Physical Therapy

## 2017-01-29 ENCOUNTER — Telehealth: Payer: Self-pay | Admitting: Neurology

## 2017-01-29 DIAGNOSIS — M545 Low back pain: Secondary | ICD-10-CM | POA: Diagnosis not present

## 2017-01-29 DIAGNOSIS — R2689 Other abnormalities of gait and mobility: Secondary | ICD-10-CM

## 2017-01-29 DIAGNOSIS — G8929 Other chronic pain: Secondary | ICD-10-CM

## 2017-01-29 DIAGNOSIS — M6281 Muscle weakness (generalized): Secondary | ICD-10-CM

## 2017-01-29 NOTE — Telephone Encounter (Signed)
Pt called and needs a prescription called in for [facility]

## 2017-01-29 NOTE — Therapy (Signed)
[place], [place], [address] Phone: [phone]   Fax:  [phone]  Physical Therapy Treatment  Patient Details  Name: MASEN SALVAS MRN: [id number] Date of Birth: 10/24/1970 Referring Provider: Pieter Partridge, DO   Encounter Date: 01/29/2017  PT End of Session - 01/29/17 0846    Visit Number  10    Number of Visits  13    Date for PT Re-Evaluation  02/13/17    Authorization Type  BCBS    PT Start Time  [phone]    PT Stop Time  0940    PT Time Calculation (min)  54 min    Activity Tolerance  Patient limited by pain    Behavior During Therapy  Anxious       Past Medical History:  Diagnosis Date  . Anemia   . Migraines     Past Surgical History:  Procedure Laterality Date  . FINGER SURGERY Right    pinkie   . KNEE SURGERY Right    x2  . PILONIDAL CYST EXCISION    . TEAR DUCT PROBING Left     There were no vitals filed for this visit.  Subjective Assessment - 01/29/17 0847    Subjective  It's not really pain, more stiffness and discomfort across low back and bilateral, lateral hips. It feels like it is cold outside.                       [facility] Adult PT Treatment/Exercise - 01/29/17 0001      Lumbar Exercises: Seated   Other Seated Lumbar Exercises  seated round back      Lumbar Exercises: Supine   Other Supine Lumbar Exercises  table top holds 5x10s    Other Supine Lumbar Exercises  hooklying mini crunches-reaching for ceiling      Lumbar Exercises: Quadruped   Other Quadruped Lumbar Exercises  child pose      Knee/Hip Exercises: Stretches   Other Knee/Hip Stretches  firgure 4    Other Knee/Hip Stretches  LTR      Knee/Hip Exercises: Aerobic   Stationary Bike  5 min L2 after TPDN      Knee/Hip Exercises: Supine   Bridges with Ball Squeeze  10 reps      Moist Heat Therapy   Number Minutes Moist Heat  10 Minutes    Moist Heat Location  Lumbar Spine      Manual Therapy   Manual therapy comments  skilled palpation & monitoring during TPDN    Soft tissue mobilization  Rt glut max       Trigger Point Dry Needling - 01/29/17 0904    Muscles Treated Lower Body  Gluteus maximus Rt                PT Long Term Goals - 01/15/17 0720      PT LONG TERM GOAL #1   Title  Pt will be able to sit at work to complete activities, utilizing proper posture and standing in appropraite time intervals to decrease limitations by back pain    Baseline  45 minutes, about the same     Time  6    Period  Weeks    Status  On-going      PT LONG TERM GOAL #2   Title  FOTO to 39% limitation to indicate significant improvement in functional ability    Baseline  53% limitation at eval  Time  6    Period  Weeks    Status  Unable to assess      PT LONG TERM GOAL #3   Title  Pt will demo gross 5/5 strength in lumbopelvic region to indicate proper suppport to biomechanical chain    Time  6    Period  Weeks    Status  On-going      PT LONG TERM GOAL #4   Title  Pt will be independent in HEP to continue strengthening and provide long term care    Time  6    Period  Weeks    Status  On-going            Plan - 01/29/17 0905    Clinical Impression Statement  Large twitch response in R glut today with DN, pt reported feeling sorness following as expected. "the knuckle is back, not a sharp/stab"  Core exercises following DN today for stabilization of lumbopelvic area. Pain centralized to Rt SIJ. May benefit from SI injection to reduce irritation at joint.     PT Treatment/Interventions  Cryotherapy;Electrical Stimulation;Iontophoresis 4mg /ml Dexamethasone;Functional mobility training;Stair training;Gait training;Ultrasound;Traction;Moist Heat;Therapeutic activities;Therapeutic exercise;Balance training;Neuromuscular re-education;Patient/family education;Passive range of motion;Manual techniques;Dry needling;Taping;ADLs/Self Care Home Management    PT  Next Visit Plan  abdominal strengthening, try extension exercises, consider returning to use of [place]    PT Home Exercise Plan  ITB stretching, hip isometric adduction, seated pelvic tilt; hundreds, tennis ball trigger point release; physioball planks; elbow/knee planks, table top holds, child pose, hooklying crunches arms OH;        Patient will benefit from skilled therapeutic intervention in order to improve the following deficits and impairments:  Pain, Decreased strength, Decreased activity tolerance, Postural dysfunction, Impaired flexibility, Improper body mechanics  Visit Diagnosis: Chronic right-sided low back pain without sciatica  Muscle weakness (generalized)  Other abnormalities of gait and mobility     Problem List There are no active problems to display for this patient.   Sherri Levenhagen C. Taitum Alms PT, DPT 01/29/17 12:07 PM   [facility] [address] [place], [place], [address] Phone: [phone]   Fax:  [phone]  Name: BRITTINI BRUBECK MRN: [id number] Date of Birth: 07/25/1970

## 2017-01-30 ENCOUNTER — Other Ambulatory Visit: Payer: Self-pay

## 2017-01-30 ENCOUNTER — Telehealth: Payer: Self-pay | Admitting: Neurology

## 2017-01-30 MED ORDER — ERENUMAB-AOOE 70 MG/ML ~~LOC~~ SOAJ: 70.0000 mg | SUBCUTANEOUS | 11 refills | Status: DC

## 2017-01-30 NOTE — Telephone Encounter (Signed)
Pt left a voicemail message saying the injector was not covered by insurance and needs a call back to find out what can be done instead

## 2017-01-30 NOTE — Telephone Encounter (Signed)
Rx sent in to [facility]

## 2017-01-30 NOTE — Telephone Encounter (Signed)
Called and spoke with Pt, advsd her to log on to: [email or web address] and give that information to her pharmacy. Advsd her if she has any problems to call me back

## 2017-02-03 ENCOUNTER — Ambulatory Visit: Payer: BLUE CROSS/BLUE SHIELD | Attending: Neurology | Admitting: Physical Therapy

## 2017-02-03 ENCOUNTER — Encounter: Payer: Self-pay | Admitting: Physical Therapy

## 2017-02-03 DIAGNOSIS — R2689 Other abnormalities of gait and mobility: Secondary | ICD-10-CM | POA: Diagnosis present

## 2017-02-03 DIAGNOSIS — M545 Low back pain, unspecified: Secondary | ICD-10-CM

## 2017-02-03 DIAGNOSIS — G8929 Other chronic pain: Secondary | ICD-10-CM | POA: Insufficient documentation

## 2017-02-03 DIAGNOSIS — M6281 Muscle weakness (generalized): Secondary | ICD-10-CM | POA: Diagnosis present

## 2017-02-03 NOTE — Patient Instructions (Signed)
  Bracing With Leg Lift (Prone)   With pillow support, lie on abdomen and neutral spine, tighten pelvic floor and abdominals and hold. Alternately raise legs off floor. Repeat _10_ times. Do _2__ times a day.   Copyright  VHI. All rights reserved.   Bracing With Arm / Leg Lift (Prone)   With pillow support, lie on abdomen. Find neutral spine. Tighten pelvic floor and abdominals and hold. Alternately raise one arm and opposite leg. Repeat __10_ times. Do 2___ times a day.    Hip Flexor Stretch    Lying on back near edge of bed, bend one leg, foot flat. Hang other leg over edge, relaxed, thigh resting entirely on bed for _1___ minutes. Repeat __1__ times. Do _2___ sessions per day. Advanced Exercise: Bend knee back keeping thigh in contact with bed.

## 2017-02-03 NOTE — Therapy (Signed)
[place], [place], [address] Phone: [phone]   Fax:  [phone]  Physical Therapy Treatment  Patient Details  Name: Karen Davis MRN: [id number] Date of Birth: [birth year] Referring Provider: Pieter Partridge, DO   Encounter Date: 02/03/2017  PT End of Session - 02/03/17 0851    Visit Number  11    Number of Visits  13    Date for PT Re-Evaluation  02/13/17    Authorization Type  BCBS    PT Start Time  0847    PT Stop Time  0945    PT Time Calculation (min)  58 min       Past Medical History:  Diagnosis Date  . Anemia   . Migraines     Past Surgical History:  Procedure Laterality Date  . FINGER SURGERY Right    pinkie   . KNEE SURGERY Right    x2  . PILONIDAL CYST EXCISION    . TEAR DUCT PROBING Left     There were no vitals filed for this visit.  Subjective Assessment - 02/03/17 0849    Subjective  I was in a lot of pain after last session.     Currently in Pain?  Yes    Pain Score  3     Pain Location  Back    Pain Orientation  Lower    Pain Descriptors / Indicators  Aching    Aggravating Factors   getting up after sitting     Pain Relieving Factors  keep moving                       [facility] Adult PT Treatment/Exercise - 02/03/17 0001      Lumbar Exercises: Supine   Other Supine Lumbar Exercises  bent knee raise, table top holds painful, dead bug level 1 ,     Other Supine Lumbar Exercises  hooklying mini crunches-reaching for ceiling      Lumbar Exercises: Prone   Single Arm Raise  10 reps    Straight Leg Raise  10 reps    Opposite Arm/Leg Raise  10 reps;Left arm/Right leg;Right arm/Left leg      Lumbar Exercises: Quadruped   Single Arm Raise  10 reps    Straight Leg Raise  10 reps    Opposite Arm/Leg Raise  10 reps pain today on right LB      Knee/Hip Exercises: Stretches   Hip Flexor Stretch  Both;60 seconds      Moist Heat Therapy   Number Minutes  Moist Heat  15 Minutes    Moist Heat Location  Lumbar Spine             PT Education - 02/03/17 0929    Education provided  Yes    Education Details  HEP    Person(s) Educated  Patient    Methods  Explanation;Handout    Comprehension  Verbalized understanding          PT Long Term Goals - 01/15/17 0720      PT LONG TERM GOAL #1   Title  Pt will be able to sit at work to complete activities, utilizing proper posture and standing in appropraite time intervals to decrease limitations by back pain    Baseline  45 minutes, about the same     Time  6    Period  Weeks    Status  On-going  PT LONG TERM GOAL #2   Title  FOTO to 39% limitation to indicate significant improvement in functional ability    Baseline  53% limitation at eval    Time  6    Period  Weeks    Status  Unable to assess      PT LONG TERM GOAL #3   Title  Pt will demo gross 5/5 strength in lumbopelvic region to indicate proper suppport to biomechanical chain    Time  6    Period  Weeks    Status  On-going      PT LONG TERM GOAL #4   Title  Pt will be independent in HEP to continue strengthening and provide long term care    Time  6    Period  Weeks    Status  On-going            Plan - 02/03/17 1030    Clinical Impression Statement  Pt reports increased pain after last session for 24 hours. Returned to baseline. 3/10 pain today mid low back to right low back. Pt reports continued difficulty with standing after sitting, feels :stuck" not pain. Began hip flexor stretching with pt feeling pain in back with left stretch, also feels pulling in sterior lateral hips. Quadruped bird dogs painful, just leg raise part is better. Focusued prone stabilization which did not increased pain. Added prone core and hip flexor stretch to HEP.     PT Next Visit Plan  abdominal strengthening, try extension exercises, consider returning to use of [place]    PT Home Exercise Plan  ITB stretching, hip isometric  adduction, seated pelvic tilt; hundreds, tennis ball trigger point release; physioball planks; elbow/knee planks, table top holds, child pose, hooklying crunches arms OH; hip flexor stretch, prone UE/LE raise     Consulted and Agree with Plan of Care  Patient       Patient will benefit from skilled therapeutic intervention in order to improve the following deficits and impairments:  Pain, Decreased strength, Decreased activity tolerance, Postural dysfunction, Impaired flexibility, Improper body mechanics  Visit Diagnosis: Chronic right-sided low back pain without sciatica  Muscle weakness (generalized)  Other abnormalities of gait and mobility     Problem List There are no active problems to display for this patient.   Dorene Ar, [place] 02/03/2017, 10:43 AM  [place], [place], [address] Phone: [phone]   Fax:  [phone]  Name: Karen Davis MRN: [id number] Date of Birth: Aug 24, 1970

## 2017-02-05 ENCOUNTER — Telehealth: Payer: Self-pay | Admitting: Neurology

## 2017-02-05 ENCOUNTER — Ambulatory Visit: Payer: BLUE CROSS/BLUE SHIELD | Admitting: Physical Therapy

## 2017-02-05 ENCOUNTER — Encounter: Payer: Self-pay | Admitting: Physical Therapy

## 2017-02-05 DIAGNOSIS — M6281 Muscle weakness (generalized): Secondary | ICD-10-CM

## 2017-02-05 DIAGNOSIS — G8929 Other chronic pain: Secondary | ICD-10-CM

## 2017-02-05 DIAGNOSIS — M545 Low back pain: Principal | ICD-10-CM

## 2017-02-05 DIAGNOSIS — M5440 Lumbago with sciatica, unspecified side: Principal | ICD-10-CM

## 2017-02-05 NOTE — Therapy (Signed)
[place], [place], [address] Phone: [phone]   Fax:  [phone]  Physical Therapy Treatment/Discharge Summary  Patient Details  Name: Karen Davis MRN: [id number] Date of Birth: [birth year] Referring Provider: Pieter Partridge, DO   Encounter Date: 02/05/2017  PT End of Session - 02/05/17 0849    Visit Number  12    Number of Visits  13    Date for PT Re-Evaluation  02/13/17    Authorization Type  BCBS    PT Start Time  0847    PT Stop Time  0914    PT Time Calculation (min)  27 min    Activity Tolerance  Patient tolerated treatment well    Behavior During Therapy  [facility] for tasks assessed/performed       Past Medical History:  Diagnosis Date  . Anemia   . Migraines     Past Surgical History:  Procedure Laterality Date  . FINGER SURGERY Right    pinkie   . KNEE SURGERY Right    x2  . PILONIDAL CYST EXCISION    . TEAR DUCT PROBING Left     There were no vitals filed for this visit.  Subjective Assessment - 02/05/17 0849    Subjective  the stabbing is no longer constant, only when I turn every so often. fells like she has been laying in bed too long deep into lateral aspects of hips. Feels pain into bilat groin, mostly in Rt.          [facility] - 02/05/17 0001      Sensation   Additional Comments  denies regular N/t      ROM / Strength   AROM / PROM / Strength  Strength      Strength   Strength Assessment Site  Hip    Right/Left Hip  Right;Left    Right Hip Flexion  5/5 SIJ pain    Right Hip Extension  5/5    Right Hip ABduction  5/5    Left Hip Flexion  5/5    Left Hip Extension  5/5    Left Hip ABduction  5/5                          PT Education - 02/05/17 0919    Education provided  Yes    Education Details  importance of continued HEP, anatomy of SIJ and Rt/Lt connection, goals, FOTO    Person(s) Educated  Patient    Methods   Explanation;Handout    Comprehension  Verbalized understanding          PT Long Term Goals - 02/05/17 0858      PT LONG TERM GOAL #1   Title  Pt will be able to sit at work to complete activities, utilizing proper posture and standing in appropraite time intervals to decrease limitations by back pain    Baseline  feels the pain, ignores it, not as severe as it was in the beginning- no "knife"    Status  Not Met      PT LONG TERM GOAL #2   Title  FOTO to 39% limitation to indicate significant improvement in functional ability    Baseline  50% limited    Status  Not Met      PT LONG TERM GOAL #3   Title  Pt will demo gross 5/5 strength in lumbopelvic region to indicate proper suppport  to biomechanical chain    Baseline  see flowsheet    Status  Achieved      PT LONG TERM GOAL #4   Title  Pt will be independent in HEP to continue strengthening and provide long term care    Baseline  doing HEP    Status  Achieved            Plan - 02/05/17 0915    Clinical Impression Statement  Pt demo 5/5 strength surrounding lumbopelvic region with pain at Rt SIJ in testing Rt hip flexion. pt reports she no longer "has a knife" but still has notable pain constantly. I believe pt would benefit from injection in SIJ to decrease joint-specific irritation and I encouraged pt to contact her referring physician regarding this idea. Provided pt with the most important stabilization exercises to maintain centralized pain and avoid spreading and worsening again. She reports "this is exactly where I was last time" meaning still having pain but "it is live-able"    Pt was encouraged to contact us with any further questions.     PT Treatment/Interventions  Cryotherapy;Electrical Stimulation;Iontophoresis 83m/ml Dexamethasone;Functional mobility training;Stair training;Gait training;Ultrasound;Traction;Moist Heat;Therapeutic activities;Therapeutic exercise;Balance training;Neuromuscular  re-education;Patient/family education;Passive range of motion;Manual techniques;Dry needling;Taping;ADLs/Self Care Home Management    PT Home Exercise Plan  ITB stretching, hip isometric adduction, seated pelvic tilt; hundreds, tennis ball trigger point release; physioball planks; elbow/knee planks, table top holds, child pose, hooklying crunches arms OH; hip flexor stretch, prone UE/LE raise        Patient will benefit from skilled therapeutic intervention in order to improve the following deficits and impairments:  Pain, Decreased strength, Decreased activity tolerance, Postural dysfunction, Impaired flexibility, Improper body mechanics  Visit Diagnosis: Chronic right-sided low back pain without sciatica  Muscle weakness (generalized)     Problem List There are no active problems to display for this patient.   PHYSICAL THERAPY DISCHARGE SUMMARY  Visits from Start of Care: 12  Current functional level related to goals / functional outcomes: See above   Remaining deficits: See above   Education / Equipment: Anatomy of condition, POC, HEP, exercise form/rationale  Plan: Patient agrees to discharge.  Patient goals were partially met. Patient is being discharged due to lack of progress.  ?????     Darran Gabay C. Sandhya Denherder PT, DPT 02/05/17 9:23 AM   [facility] [address]Phone: [phone]  Fax:  [phone] Name: RMARAH PARKMRN: [id number] of Birth: 708/05/1970

## 2017-02-05 NOTE — Telephone Encounter (Signed)
Patient called to regarding PT that she has been taking. She has now finished that and they are recommending her get shots in her [place]? She is unsure of what the next step should be. Please call. Thanks

## 2017-02-05 NOTE — Telephone Encounter (Signed)
Should Pt follow recommendations for injections or is there something else you would suggest?

## 2017-02-06 NOTE — Telephone Encounter (Signed)
We may need to refer her to orthopedics or possibly PM&R

## 2017-02-06 NOTE — Telephone Encounter (Signed)
Called and spoke with Pt, she rqsts a referral to [facility].

## 2017-02-16 ENCOUNTER — Other Ambulatory Visit: Payer: Self-pay | Admitting: Neurology

## 2017-03-03 HISTORY — PX: PARTIAL HYSTERECTOMY: SHX80

## 2017-03-05 ENCOUNTER — Ambulatory Visit (INDEPENDENT_AMBULATORY_CARE_PROVIDER_SITE_OTHER): Payer: BLUE CROSS/BLUE SHIELD | Admitting: Neurology

## 2017-03-05 ENCOUNTER — Encounter: Payer: Self-pay | Admitting: Neurology

## 2017-03-05 VITALS — BP 124/76 | HR 79 | Ht 64.0 in | Wt 182.2 lb

## 2017-03-05 DIAGNOSIS — G43009 Migraine without aura, not intractable, without status migrainosus: Secondary | ICD-10-CM | POA: Diagnosis not present

## 2017-03-05 DIAGNOSIS — R200 Anesthesia of skin: Secondary | ICD-10-CM

## 2017-03-05 NOTE — Patient Instructions (Signed)
1.  Continue [name] and sumatriptan 2.  Follow up in 5 months.
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
## 2017-03-19 NOTE — Telephone Encounter (Signed)
[name] denied on cover my meds

## 2017-05-05 DIAGNOSIS — M79645 Pain in left finger(s): Secondary | ICD-10-CM | POA: Insufficient documentation

## 2017-05-14 ENCOUNTER — Telehealth: Payer: Self-pay

## 2017-05-14 NOTE — Telephone Encounter (Signed)
PA for [name] 70 mg was not covered by insurance and will need to consider alternative medication. Doctor will need to go to  [email or web address] and under tools/resource will need to click documents and than precision formulary to decide which alternative is best for the patient

## 2017-05-15 NOTE — Telephone Encounter (Signed)
Could we please contact the rep to see what steps we need to take for her to receive the [place]?  Thank you

## 2017-05-20 NOTE — Telephone Encounter (Signed)
Received PA Determination notice and faxed OV note and trial of medication were listed in patient note. Contacted [facility] management at [phone] and advise them to review the notes to the answers of their questions require for [name].

## 2017-05-22 NOTE — Telephone Encounter (Signed)
PA for [name] 70 mg approved from 05/19/2017 through 08/22/2017 for [facility]

## 2017-06-25 ENCOUNTER — Encounter: Payer: Self-pay | Admitting: Neurology

## 2017-08-03 ENCOUNTER — Ambulatory Visit (INDEPENDENT_AMBULATORY_CARE_PROVIDER_SITE_OTHER): Payer: BLUE CROSS/BLUE SHIELD | Admitting: Neurology

## 2017-08-03 ENCOUNTER — Encounter: Payer: Self-pay | Admitting: Neurology

## 2017-08-03 VITALS — BP 104/78 | HR 71 | Ht 63.5 in | Wt 166.0 lb

## 2017-08-03 DIAGNOSIS — M542 Cervicalgia: Secondary | ICD-10-CM

## 2017-08-03 DIAGNOSIS — G44219 Episodic tension-type headache, not intractable: Secondary | ICD-10-CM

## 2017-08-03 DIAGNOSIS — G43009 Migraine without aura, not intractable, without status migrainosus: Secondary | ICD-10-CM

## 2017-08-03 MED ORDER — TIZANIDINE HCL 4 MG PO TABS: 2.0000 mg | ORAL_TABLET | Freq: Every day | ORAL | 3 refills | Status: DC

## 2017-08-03 NOTE — Progress Notes (Signed)
NEUROLOGY FOLLOW UP OFFICE NOTE  KHELANI KOPS [id number]  HISTORY OF PRESENT ILLNESS: Karen Davis is a 47 year old right-handed woman with iron deficiency anemia, depression and multinodular goiter who follows up for migraine.   UPDATE: She has noted improvement in migraine frequency since starting [name].  Initially they remained about 2 a month in the beginning of the year.  In May, she has had 5 headaches, however they appear to be tension type headache, a moderate aching in the back of her head with neck ache and no associated symptoms.  They respond quickly to ibuprofen  Intensity:  4-6/10 Duration:  30 to 60 minutes with sumatriptan Frequency:  As above Current NSAIDS:  Ibuprofen 400mg  (for tension type headache) Current analgesics:  no Current triptans:  sumatriptan 100mg  (for migraines) Current anti-emetic:  no Current muscle relaxants:  no Current anti-anxiolytic:  no Current sleep aide:  no Current Antihypertensive medications:   Current Antidepressant medications:  no Current Anticonvulsant medications:  no Current CGRP inhibitor:  [name] 70mg  monthly Current Vitamins/Herbal/Supplements:  Centrum, iron Current Antihistamines/Decongestants:  no Other therapy:  no   Caffeine:  Coffee, tea, soda Alcohol:  no Smoker:  no Diet:  Needs to increase hydration Exercise:  Not routine Depression/stress:  controlled Sleep hygiene:  Poor.  Carpal tunnel symptoms have kept her up at night.  Wrist splints no longer working.  She attributes it to recent weight gain.   HISTORY: Onset:  About 3 or 4 years ago Location:  Right maxillary/periorbital region, sometimes radiating to occipital region Quality:  Pressure, progressing to stabbing Initial Intensity:  10/10; May:  8/10 Aura:  no Prodrome:  no Associated symptoms:  Nausea, photophobia Initial Duration:  30 minutes with sumatriptan, otherwise several hours; May:  Usually 1 hour Initial Frequency:  6 days per  month; May:  Every other day (15 headache days in last month) Triggers/exacerbating factors:  Change in barometric pressure, heat Relieving factors:  sumatriptan Activity:  Needs to lay down   Past NSAIDS:  flurbiprofen, oxaprozin, ibuprofen, ketoprofen, etodolac, Mobic Past analgesics:  Excedrin, Tylenol Past abortive triptans:  no Past muscle relaxants:  no Past anti-emetic:  no Past anti-anxiolytic:  no Past sleep aide:  no  Past antihypertensive medications:  verapamil HCL ER 120mg  (was effective but she stopped due to feeling depressed) Past antidepressant medications:  Nortriptyline 25mg  Past anticonvulsant medications:  Topiramate (effective but caused diarrhea) Past vitamins/Herbal/Supplements:  riboflavin Past antihistamines/decongestants:  no   Family history of headache:  Mother had migraines.  Brother had stroke in his 15s.  Paternal great grandfather had stroke in his 23s.   MRI and MRA of head and MRA of neck from 11/12/13 were personally reviewed and were unremarkable.   She also reports bilateral hand numbness and tingling that can radiate up to above elbows.  They are prominent when in bed and often wakes her up.  She denies neck pain or weakness.  NCV-EMG was normal.  No evidence of carpal tunnel.  She does wear wrist splints at night when it flares up and it helps.   She also has chronic low back pain since slipping and falling on her coccyx around 2015.  She feels right lower back pain radiating down into her buttocks and associated with numbness and tingling of her anterior thigh.  There is no weakness.  She had PT for low back pain.  Traction helped.  PAST MEDICAL HISTORY: Past Medical History:  Diagnosis Date  . Anemia   .  Migraines     MEDICATIONS: Current Outpatient Medications on File Prior to Visit  Medication Sig Dispense Refill  . Erenumab-aooe ([name]) 70 MG/ML SOAJ Inject 70 mg into the skin every 30 (thirty) days. 2 pen 0  . Erenumab-aooe ([name])  70 MG/ML SOAJ Inject 70 mg into the skin every 30 (thirty) days. 1 pen 11  . naproxen (NAPROSYN) 500 MG tablet Take 500-1,000 mg by mouth daily as needed for moderate pain.    . nortriptyline (PAMELOR) 10 MG capsule Take 1 capsule (10 mg total) by mouth at bedtime. (Patient not taking: Reported on 08/03/2017) 30 capsule 2  . SUMAtriptan (IMITREX) 100 MG tablet TAKE 1 TABLET BY MOUTH AT EARLIEST ONSET OF HEADACHE. MAY REPEAT ONCE IN 2 HOURS IF NEEDED. 10 tablet 3   No current facility-administered medications on file prior to visit.     ALLERGIES: Allergies  Allergen Reactions  . Vantin [Cefpodoxime] Other (See Comments)    migraine    FAMILY HISTORY: Family History  Problem Relation Age of Onset  . Melanoma Mother   . Liver disease Father   . Stroke Brother     SOCIAL HISTORY: Social History   Socioeconomic History  . Marital status: Married    Spouse name: Not on file  . Number of children: Not on file  . Years of education: Not on file  . Highest education level: Not on file  Occupational History  . Not on file  Social Needs  . Financial resource strain: Not on file  . Food insecurity:    Worry: Not on file    Inability: Not on file  . Transportation needs:    Medical: Not on file    Non-medical: Not on file  Tobacco Use  . Smoking status: Former Smoker    Last attempt to quit: 10/08/2003    Years since quitting: 13.8  . Smokeless tobacco: Never Used  Substance and Sexual Activity  . Alcohol use: No  . Drug use: No  . Sexual activity: Not on file  Lifestyle  . Physical activity:    Days per week: Not on file    Minutes per session: Not on file  . Stress: Not on file  Relationships  . Social connections:    Talks on phone: Not on file    Gets together: Not on file    Attends religious service: Not on file    Active member of club or organization: Not on file    Attends meetings of clubs or organizations: Not on file    Relationship status: Not on file  .  Intimate partner violence:    Fear of current or ex partner: Not on file    Emotionally abused: Not on file    Physically abused: Not on file    Forced sexual activity: Not on file  Other Topics Concern  . Not on file  Social History Narrative  . Not on file    REVIEW OF SYSTEMS: Constitutional: No fevers, chills, or sweats, no generalized fatigue, change in appetite Eyes: No visual changes, double vision, eye pain Ear, nose and throat: No hearing loss, ear pain, nasal congestion, sore throat Cardiovascular: No chest pain, palpitations Respiratory:  No shortness of breath at rest or with exertion, wheezes GastrointestinaI: No nausea, vomiting, diarrhea, abdominal pain, fecal incontinence Genitourinary:  No dysuria, urinary retention or frequency Musculoskeletal:  Neck pain Integumentary: No rash, pruritus, skin lesions Neurological: as above Psychiatric: No depression, insomnia, anxiety Endocrine: No palpitations, fatigue, diaphoresis,  mood swings, change in appetite, change in weight, increased thirst Hematologic/Lymphatic:  No purpura, petechiae. Allergic/Immunologic: no itchy/runny eyes, nasal congestion, recent allergic reactions, rashes  PHYSICAL EXAM: Vitals:   08/03/17 0752  BP: 104/78  Pulse: 71  SpO2: 99%   General: No acute distress.  Patient appears well-groomed.  Head:  Normocephalic/atraumatic Eyes:  Fundi examined but not visualized Neck: supple, bilateral paraspinal tenderness, full range of motion Heart:  Regular rate and rhythm Lungs:  Clear to auscultation bilaterally Back: No paraspinal tenderness Neurological Exam: alert and oriented to person, place, and time. Attention span and concentration intact, recent and remote memory intact, fund of knowledge intact.  Speech fluent and not dysarthric, language intact.  CN II-XII intact. Bulk and tone normal, muscle strength 5/5 throughout.  Sensation to light touch, temperature and vibration intact.  Deep tendon  reflexes 2+ throughout, toes downgoing.  Finger to nose testing intact.  Gait normal, Romberg negative.  IMPRESSION: Episodic migraine without aura, stable Episodic tension-type headache, not intractable, likely cervicogenic  Cervicalgia  PLAN: 1.  Continue [name] 70mg  monthlyl 2.  Ibuprofen for tension-type headache, sumatriptan for migraine 3.  Try tizanidine 2mg  at bedtime (may increase to 4mg  at bedtime if needed).  If ineffective, consider referral to Sports Medicine for OMT. 4.  Limit pain relievers to no more than 2 days out of week 5.  Continue headache diary 6.  Follow up in 6 months.  Metta Clines, DO  CC: Dr. Dorthy Cooler

## 2017-08-03 NOTE — Patient Instructions (Addendum)
1.  Continue [name] monthly 2  Use sumatriptan for migraines and ibuprofen for tension type headache 3.  Start tizanidine 4mg , 1/2 tablet (2mg ) at bedtime for neck pain.  If not effective, then increase to 1 full tablet (4mg ).  If not effective, then I can refer you to Sports Medicine for physical therapy on the neck 4.  Follow up in 6 months.

## 2017-08-11 NOTE — Progress Notes (Signed)
Attempted on cover my meds, not able to verify insurance.  Faxing to [facility] for PA

## 2017-08-18 NOTE — Progress Notes (Addendum)
Called [facility] to check status of PA, spoke with Melissa. She states this is being filled at [facility] [phone].  Called and spoke with Bethena Roys, advised to call [phone].  Called and spoke with Manuela Schwartz. [name] has not been authorized through [employer]. Called [facility], spoke with Nicole Kindred. Pt is filling with manufacturer copay card, is valid until 01/2018

## 2017-08-20 NOTE — Progress Notes (Addendum)
Rcvd call from [place] with cover my meds, he will send fax concerning appeal

## 2017-08-21 NOTE — Progress Notes (Signed)
Rcvd fax from [place] indicating to call for appeal process. [place] [phone], spoke with Loma Sousa, gave clinical information. [name] approved 3 months 08/21/17 - 11/19/17.

## 2017-10-19 ENCOUNTER — Encounter: Payer: BLUE CROSS/BLUE SHIELD | Admitting: Psychology

## 2018-02-02 NOTE — Progress Notes (Signed)
NEUROLOGY FOLLOW UP OFFICE NOTE  Karen Davis [id number]  HISTORY OF PRESENT ILLNESS: Karen Davis is a 47 year old right-handed woman who follows up for migraines.  UPDATE: Intensity:  Moderate to severe Duration:  6-8 hours (repeated sumatriptan and then Tylenol sinus) Frequency:  Prior to this, they occurred 5 days a month.  This past month migraines have been daily.  Likely related to stress.  She separated from her husband this past August.  ENT says no sinusitis. Frequency of abortive medication: most days a week Current NSAIDS:  none Current analgesics:  Tylenol Sinus Current triptans:  Sumatriptan 100mg  Current ergotamine:  None Current anti-emetic:  None Current muscle relaxants:  tizandine None Current anti-anxiolytic:  None Current sleep aide:  None Current Antihypertensive medications:  None Current Antidepressant medications:  None Current Anticonvulsant medications:  None Current anti-CGRP:  [name] 70mg  Current Vitamins/Herbal/Supplements:  Centrum, iron Current Antihistamines/Decongestants:  None Other therapy:  None Hormone/birth control:  None  Caffeine:  Coffee, tea, soda Diet:  hydrates Exercise:  Not routine Depression:  no; Anxiety:  no Other pain:  Neck pain Sleep hygiene:  ok  HISTORY: Onset:  About 3 or 4 years ago Location:  Right maxillary/periorbital region, sometimes radiating to occipital region Quality:  Pressure, progressing to stabbing Initial Intensity:  10/10; May:  8/10 Aura:  no Prodrome:  no Associated symptoms:  Nausea, photophobia Initial Duration:  30 minutes with sumatriptan, otherwise several hours; May:  Usually 1 hour Initial Frequency:  6 days per month; May:  Every other day (15 headache days in last month) Triggers/exacerbating factors:  Change in barometric pressure, heat Relieving factors:  sumatriptan Activity:  Needs to lay down  Past NSAIDS:  flurbiprofen, oxaprozin, ibuprofen, ketoprofen, etodolac,  Mobic Past analgesics:  Excedrin, Tylenol Past abortive triptans:  no Past muscle relaxants:  no Past anti-emetic:  no Past anti-anxiolytic:  no Past sleep aide:  no  Past antihypertensive medications:  verapamil HCL ER 120mg  (was effective but she stopped due to feeling depressed) Past antidepressant medications:  Nortriptyline 25mg  Past anticonvulsant medications:  Topiramate (effective but caused diarrhea) Past vitamins/Herbal/Supplements:  riboflavin Past antihistamines/decongestants:  no  Family history of headache:  Mother had migraines.  Brother had stroke in his 48s.  Paternal great grandfather had stroke in his 76s.  MRI and MRA of head and MRA of neck from 11/12/13 were personally reviewed and were unremarkable.  She also reports bilateral hand numbness and tingling that can radiate up to above elbows.  They are prominent when in bed and often wakes her up.  She denies neck pain or weakness.  NCV-EMG was normal.  No evidence of carpal tunnel.  She does wear wrist splints at night when it flares up and it helps.  She also has chronic low back pain since slipping and falling on her coccyx around 2015.  She feels right lower back pain radiating down into her buttocks and associated with numbness and tingling of her anterior thigh.  There is no weakness.  She had PT for low back pain.  Traction helped.  PAST MEDICAL HISTORY: Past Medical History:  Diagnosis Date  . Anemia   . Migraines     MEDICATIONS: Current Outpatient Medications on File Prior to Visit  Medication Sig Dispense Refill  . Erenumab-aooe ([name]) 70 MG/ML SOAJ Inject 70 mg into the skin every 30 (thirty) days. 2 pen 0  . Erenumab-aooe ([name]) 70 MG/ML SOAJ Inject 70 mg into the skin every 30 (thirty) days.  1 pen 11  . naproxen (NAPROSYN) 500 MG tablet Take 500-1,000 mg by mouth daily as needed for moderate pain.    . nortriptyline (PAMELOR) 10 MG capsule Take 1 capsule (10 mg total) by mouth at bedtime.  (Patient not taking: Reported on 08/03/2017) 30 capsule 2  . SUMAtriptan (IMITREX) 100 MG tablet TAKE 1 TABLET BY MOUTH AT EARLIEST ONSET OF HEADACHE. MAY REPEAT ONCE IN 2 HOURS IF NEEDED. 10 tablet 3  . tiZANidine (ZANAFLEX) 4 MG tablet Take 0.5 tablets (2 mg total) by mouth at bedtime. 30 tablet 3   No current facility-administered medications on file prior to visit.     ALLERGIES: Allergies  Allergen Reactions  . Vantin [Cefpodoxime] Other (See Comments)    migraine    FAMILY HISTORY: Family History  Problem Relation Age of Onset  . Melanoma Mother   . Liver disease Father   . Stroke Brother    SOCIAL HISTORY: Social History   Socioeconomic History  . Marital status: Married    Spouse name: Not on file  . Number of children: Not on file  . Years of education: Not on file  . Highest education level: Not on file  Occupational History  . Not on file  Social Needs  . Financial resource strain: Not on file  . Food insecurity:    Worry: Not on file    Inability: Not on file  . Transportation needs:    Medical: Not on file    Non-medical: Not on file  Tobacco Use  . Smoking status: Former Smoker    Last attempt to quit: 10/08/2003    Years since quitting: 14.3  . Smokeless tobacco: Never Used  Substance and Sexual Activity  . Alcohol use: No  . Drug use: No  . Sexual activity: Not on file  Lifestyle  . Physical activity:    Days per week: Not on file    Minutes per session: Not on file  . Stress: Not on file  Relationships  . Social connections:    Talks on phone: Not on file    Gets together: Not on file    Attends religious service: Not on file    Active member of club or organization: Not on file    Attends meetings of clubs or organizations: Not on file    Relationship status: Not on file  . Intimate partner violence:    Fear of current or ex partner: Not on file    Emotionally abused: Not on file    Physically abused: Not on file    Forced sexual  activity: Not on file  Other Topics Concern  . Not on file  Social History Narrative  . Not on file    REVIEW OF SYSTEMS: Constitutional: No fevers, chills, or sweats, no generalized fatigue, change in appetite Eyes: No visual changes, double vision, eye pain Ear, nose and throat: No hearing loss, ear pain, nasal congestion, sore throat Cardiovascular: No chest pain, palpitations Respiratory:  No shortness of breath at rest or with exertion, wheezes GastrointestinaI: No nausea, vomiting, diarrhea, abdominal pain, fecal incontinence Genitourinary:  No dysuria, urinary retention or frequency Musculoskeletal:  No neck pain, back pain Integumentary: No rash, pruritus, skin lesions Neurological: as above Psychiatric: No depression, insomnia, anxiety Endocrine: No palpitations, fatigue, diaphoresis, mood swings, change in appetite, change in weight, increased thirst Hematologic/Lymphatic:  No purpura, petechiae. Allergic/Immunologic: no itchy/runny eyes, nasal congestion, recent allergic reactions, rashes  PHYSICAL EXAM: Blood pressure 112/82, pulse 71, height  5' 3.5" (1.613 m), weight 167 lb (75.8 kg), SpO2 99 %. General: No acute distress.  Patient appears well-groomed.  Head:  Normocephalic/atraumatic Eyes:  Fundi examined but not visualized Neck: supple, no paraspinal tenderness, full range of motion Heart:  Regular rate and rhythm Lungs:  Clear to auscultation bilaterally Back: No paraspinal tenderness Neurological Exam: alert and oriented to person, place, and time. Attention span and concentration intact, recent and remote memory intact, fund of knowledge intact.  Speech fluent and not dysarthric, language intact.  CN II-XII intact. Bulk and tone normal, muscle strength 5/5 throughout.  Sensation to light touch, temperature and vibration intact.  Deep tendon reflexes 2+ throughout, toes downgoing.  Finger to nose and heel to shin testing intact.  Gait normal, Romberg  negative.  IMPRESSION: 1.  Migraine without aura, without status migrainosus, not intractable 2.  Episodic tension-type headache, not intractable  PLAN: 1.  Increase [name] to 140mg  monthly 2.  Stop sumatriptan.  Instead will try Maxalt MLT 10mg  for abortive therapy 3.  Limit use of pain relievers to no more than 2 days out of week to prevent risk of rebound or medication-overuse headache. 4.  Keep headache diary 5.  Follow up in 3 to 4 months.  Metta Clines, DO  CC: Lujean Amel, MD

## 2018-02-03 ENCOUNTER — Ambulatory Visit (INDEPENDENT_AMBULATORY_CARE_PROVIDER_SITE_OTHER): Payer: BLUE CROSS/BLUE SHIELD | Admitting: Neurology

## 2018-02-03 ENCOUNTER — Encounter: Payer: Self-pay | Admitting: Neurology

## 2018-02-03 VITALS — BP 112/82 | HR 71 | Ht 63.5 in | Wt 167.0 lb

## 2018-02-03 DIAGNOSIS — G43009 Migraine without aura, not intractable, without status migrainosus: Secondary | ICD-10-CM

## 2018-02-03 MED ORDER — RIZATRIPTAN BENZOATE 10 MG PO TBDP: ORAL_TABLET | ORAL | 3 refills | Status: DC

## 2018-02-03 MED ORDER — ERENUMAB-AOOE 140 MG/ML ~~LOC~~ SOAJ
140.0000 mg | Freq: Once | SUBCUTANEOUS | 0 refills | Status: AC
Start: 2018-02-03 — End: 2018-02-03

## 2018-02-03 MED ORDER — ERENUMAB-AOOE 140 MG/ML ~~LOC~~ SOAJ: 140.0000 mg | SUBCUTANEOUS | 11 refills | Status: DC

## 2018-02-03 NOTE — Patient Instructions (Signed)
1.  We will increase [name] to 140mg  monthly 2.  Stop sumatriptan.  Instead take rizatriptan 10mg  at earliest onset of migraine. May repeat dose once after 2 hours if needed (no more than 2 tablets in 24 hours) 3.  Limit use of pain relievers to no more than 2 days out of week to prevent risk of rebound or medication-overuse headache. 4.  Keep headache diary 5.  Follow up in 3 to 4 months.

## 2018-03-03 HISTORY — PX: OTHER SURGICAL HISTORY: SHX169

## 2018-05-19 DIAGNOSIS — M25521 Pain in right elbow: Secondary | ICD-10-CM | POA: Insufficient documentation

## 2018-06-03 NOTE — Progress Notes (Signed)
Virtual Visit via Video Note The purpose of this virtual visit is to provide medical care while limiting exposure to the novel coronavirus.    Consent was obtained for video visit:  Yes.   Answered questions that patient had about telehealth interaction:  Yes.   I discussed the limitations, risks, security and privacy concerns of performing an evaluation and management service by telemedicine. I also discussed with the patient that there may be a patient responsible charge related to this service. The patient expressed understanding and agreed to proceed.  Pt location: Home Physician Location: office Name of referring provider:  Lujean Amel, MD I connected with Karen Davis at patients initiation/request on 06/07/2018 at 11:00 AM EDT by video enabled telemedicine application and verified that I am speaking with the correct person using two identifiers. Pt MRN:  [id number] Pt DOB:  12/21/1970 Video Participants:  Karen Davis   History of Present Illness:  Karen Davis is a 48 year old right-handed woman who follows up for migraines.  UPDATE: [name] was increased to 140mg .  Sumatriptan was switched to rizatriptan.  Headaches are back down to once a week, however rizatriptan is ineffective. Intensity:  Moderate to severe Duration:  6-8 hours (repeated rizatriptan and then Tylenol sinus) Frequency:  once a week.   Frequency of abortive medication: most days a week Current NSAIDS:  none Current analgesics:  Tylenol Sinus Current triptans:  Maxalt MLT 10mg  Current ergotamine:  None Current anti-emetic:  None Current muscle relaxants:  tizandine None Current anti-anxiolytic:  None Current sleep aide:  None Current Antihypertensive medications:  None Current Antidepressant medications:  None Current Anticonvulsant medications:  None Current anti-CGRP:  [name] 140mg  Current Vitamins/Herbal/Supplements:  Centrum, iron Current Antihistamines/Decongestants:  None Other  therapy:  None Hormone/birth control:  None  Caffeine:  Coffee, tea, soda Diet:  hydrates Exercise:  Not routine Depression:  no; Anxiety:  no Other pain:  Neck pain Sleep hygiene:  ok  HISTORY:  Onset: 2013 or 2014 Location: Right maxillary/periorbital region, sometimes radiating to occipital region Quality: Pressure, progressing to stabbing Initial Intensity: 10/10; May: 8/10 Aura: no Prodrome: no Associated symptoms: Nausea, photophobia Initial Duration: 30 minutes with sumatriptan, otherwise several hours; May: Usually 1 hour Initial Frequency: 6 days per month; May: Every other day (15 headache days in last month) Triggers: Change in barometric pressure, heat Relieving factors: Sumatriptan Activity: Needs to lay down  Past NSAIDS: flurbiprofen, oxaprozin, ibuprofen, ketoprofen, etodolac, Mobic Past analgesics: Excedrin, Tylenol Past abortive triptans: sumatriptan 100mg  Past muscle relaxants: no Past anti-emetic: no Past anti-anxiolytic: no Past sleep aide: no  Past antihypertensive medications: verapamil HCL ER 120mg  (was effective but she stopped due to feeling depressed) Past antidepressant medications: Nortriptyline 25mg  Past anticonvulsant medications: Topiramate (effective but caused diarrhea) Past vitamins/Herbal/Supplements: riboflavin Past antihistamines/decongestants: no  Family history of headache: Mother had migraines. Brother had stroke in his 68s. Paternal great grandfather had stroke in his 66s.  MRI and MRA of head and MRA of neck from 11/12/13 were personally reviewed and were unremarkable.  She also reports bilateral hand numbness and tingling that can radiate up to above elbows. They are prominent when in bed and often wakes her up. She denies neck pain or weakness. NCV-EMG was normal. No evidence of carpal tunnel. She does wear wrist splints at night when it flares up and it helps.  She also has chronic low back  pain since slipping and falling on her coccyx around 2015. She feels right lower back pain radiating down  into her buttocks and associated with numbness and tingling of her anterior thigh. There is no weakness. She had PT for low back pain. Traction helped.  Past Medical History: Past Medical History:  Diagnosis Date  . Anemia   . Migraines    Medications:  Outpatient Encounter Medications as of 06/07/2018  Medication Sig  . Multiple Vitamin (MULTIVITAMIN) tablet Take 1 tablet by mouth daily.  Eduard Roux ([name]) 140 MG/ML SOAJ Inject 140 mg into the skin every 30 (thirty) days.  [place] Succinate (REYVOW) 100 MG TABS Take 1 tablet by mouth as needed (Maximum 1 tablet in 24 hours).  . naproxen (NAPROSYN) 500 MG tablet Take 500-1,000 mg by mouth daily as needed for moderate pain.  . rizatriptan (MAXALT-MLT) 10 MG disintegrating tablet Take 1 tablet earliest onset of migraine.  May repeat x1 in 2 hours if needed.  Do not exceed 2 tablets in 24h  . tiZANidine (ZANAFLEX) 4 MG tablet Take 0.5 tablets (2 mg total) by mouth at bedtime.  . [DISCONTINUED] Erenumab-aooe ([name]) 70 MG/ML SOAJ Inject 70 mg into the skin every 30 (thirty) days.  . [DISCONTINUED] Erenumab-aooe ([name]) 70 MG/ML SOAJ Inject 70 mg into the skin every 30 (thirty) days.   No facility-administered encounter medications on file as of 06/07/2018.    Allergies: Allergies  Allergen Reactions  . Vantin [Cefpodoxime] Other (See Comments)    migraine   Family History: Family History  Problem Relation Age of Onset  . Melanoma Mother   . Liver disease Father   . Stroke Brother    Social History: Social History   Socioeconomic History  . Marital status: Married    Spouse name: Not on file  . Number of children: Not on file  . Years of education: Not on file  . Highest education level: Not on file  Occupational History  . Not on file  Social Needs  . Financial resource strain: Not on file  . Food  insecurity:    Worry: Not on file    Inability: Not on file  . Transportation needs:    Medical: Not on file    Non-medical: Not on file  Tobacco Use  . Smoking status: Former Smoker    Last attempt to quit: 10/08/2003    Years since quitting: 14.6  . Smokeless tobacco: Never Used  Substance and Sexual Activity  . Alcohol use: No  . Drug use: No  . Sexual activity: Not on file  Lifestyle  . Physical activity:    Days per week: Not on file    Minutes per session: Not on file  . Stress: Not on file  Relationships  . Social connections:    Talks on phone: Not on file    Gets together: Not on file    Attends religious service: Not on file    Active member of club or organization: Not on file    Attends meetings of clubs or organizations: Not on file    Relationship status: Not on file  . Intimate partner violence:    Fear of current or ex partner: Not on file    Emotionally abused: Not on file    Physically abused: Not on file    Forced sexual activity: Not on file  Other Topics Concern  . Not on file  Social History Narrative  . Not on file   Review Of Systems: Review of Systems  Constitutional: Negative for chills, fever and malaise/fatigue.  HENT: Negative for congestion, ear discharge,  ear pain, sore throat and tinnitus.   Eyes: Negative for blurred vision, double vision, pain, discharge and redness.  Respiratory: Negative for cough, shortness of breath and wheezing.   Cardiovascular: Negative for chest pain, palpitations and orthopnea.  Gastrointestinal: Negative for abdominal pain, constipation, diarrhea, nausea and vomiting.  Genitourinary: Negative for dysuria.  Musculoskeletal: Negative for myalgias.  Skin: Negative for itching and rash.  Neurological: Negative for tremors, sensory change, speech change, focal weakness and seizures.  Endo/Heme/Allergies: Negative for environmental allergies. Does not bruise/bleed easily.  Psychiatric/Behavioral: Negative for  depression. The patient is not nervous/anxious.    Observations/Objective:   Pulse 96, temperature 98.1 F (36.7 C), height 5' 3.5" (1.613 m), weight 165 lb (74.8 kg). alert and oriented to person, place, and time. Attention span and concentration intact, recent and remote memory intact, fund of knowledge intact.  Speech fluent and not dysarthric, language intact.  Moves eyes in all direction.  Face symmetric.  Assessment and Plan:   Migraine without aura, without status migrainosus, not intractable  1.  For preventative management, continue [name] 140 mg monthly 2.  For abortive therapy, she will stop rizatriptan and instead try Reyvow 100 mg. 3.  Limit use of pain relievers to no more than 2 days out of week to prevent risk of rebound or medication-overuse headache. 4.  Keep headache diary 5.  Exercise, hydration, caffeine cessation, sleep hygiene, monitor for and avoid triggers 6.  Consider:  magnesium citrate 400mg  daily, riboflavin 400mg  daily, and coenzyme Q10 100mg  three times daily 7.  Follow up in 4 months  Follow Up Instructions:    -I discussed the assessment and treatment plan with the patient. The patient was provided an opportunity to ask questions and all were answered. The patient agreed with the plan and demonstrated an understanding of the instructions.   The patient was advised to call back or seek an in-person evaluation if the symptoms worsen or if the condition fails to improve as anticipated.   Dudley Major, DO

## 2018-06-07 ENCOUNTER — Telehealth (INDEPENDENT_AMBULATORY_CARE_PROVIDER_SITE_OTHER): Payer: BLUE CROSS/BLUE SHIELD | Admitting: Neurology

## 2018-06-07 ENCOUNTER — Other Ambulatory Visit: Payer: Self-pay

## 2018-06-07 ENCOUNTER — Encounter: Payer: Self-pay | Admitting: Neurology

## 2018-06-07 ENCOUNTER — Telehealth: Payer: Self-pay | Admitting: Neurology

## 2018-06-07 VITALS — HR 96 | Temp 98.1°F | Ht 63.5 in | Wt 165.0 lb

## 2018-06-07 DIAGNOSIS — G43009 Migraine without aura, not intractable, without status migrainosus: Secondary | ICD-10-CM

## 2018-06-07 MED ORDER — LASMIDITAN SUCCINATE 100 MG PO TABS: 1.0000 | ORAL_TABLET | ORAL | 3 refills | Status: DC | PRN

## 2018-06-07 NOTE — Telephone Encounter (Signed)
Patient is calling in wanting to speak with you regarding the medication that was prescribed today. She said it is coming in expensive. Please call her.

## 2018-06-15 ENCOUNTER — Ambulatory Visit: Payer: BLUE CROSS/BLUE SHIELD | Admitting: Neurology

## 2018-07-06 ENCOUNTER — Telehealth: Payer: Self-pay | Admitting: Neurology

## 2018-07-06 NOTE — Telephone Encounter (Signed)
Patient is needing a PA for- [name] to [employer] at [facility]. Please complete this for her prescription. Thanks!

## 2018-07-08 NOTE — Telephone Encounter (Signed)
Called Pt, LMOVM advising PA is dnot going through and that we do not have updated insurance card. I asked if she would please call and provide [place] with that information. I advised her she may go to [email or web address] and register and receive the medication for $5 of free for up to 12 months.

## 2018-07-08 NOTE — Telephone Encounter (Signed)
Patient left VM with after hours about her PA. She is wanting an update. Please call her back at [phone]. Thanks!

## 2018-07-09 ENCOUNTER — Encounter: Payer: Self-pay | Admitting: *Deleted

## 2018-07-09 NOTE — Progress Notes (Addendum)
Hyman Bower (Key: [id number])  Rx #: [id number]  [name] 140MG /ML auto-injectors  Form [facility] Electronic Prior Authorization Form (2017 NCPDP)  Created  2 months ago  Sent to Plan  4 days ago  Plan Response  4 days ago  Submit Clinical Questions  4 days ago  Determination  Unfavorable  4 days ago  Message from Plan We received your request for prior authorization for [name], for the above member; however, [facility] has a denied request on file for [name] for this member. Please follow the appeals process outlined in the original denial or contact Prior Authorization Department at [phone] for further questions  Elzora Cullins (Key: [facility])  Rx #: [id number]  [name] 140MG /ML auto-injectors  Form [facility] Electronic Prior Authorization Form (2017 NCPDP)  Created  2 months ago  Sent to Plan  4 days ago  Plan Response  4 days ago  Submit Clinical Questions  4 days ago  Determination  Unfavorable  4 days ago  Message from Plan We received your request for prior authorization for [name], for the above member; however, [facility] has a denied request on file for [name] for this member. Please follow the appeals process outlined in the original denial or contact Prior Authorization Department at [phone] for further questions.   Hyman Bower (Key: [id number])  Rx #: [id number]  [name] 140MG /ML auto-injectors  Form [facility] Electronic Prior Authorization Form (2017 NCPDP)  Created  1 month ago  Sent to Plan  33 minutes ago  Plan Response  32 minutes ago  Submit Clinical Questions  less than a minute ago  Determination  Wait for Determination Please wait for [facility] 2017 NCPDP to return a determination.

## 2018-07-12 ENCOUNTER — Telehealth: Payer: Self-pay | Admitting: Neurology

## 2018-07-12 NOTE — Telephone Encounter (Signed)
Patient states that Karen Davis was working on getting a RX approved and we need to talk to [facility]

## 2018-07-14 NOTE — Telephone Encounter (Signed)
Pt called and spoke with Hoyle Sauer. Will work on appeal on Friday

## 2018-07-15 ENCOUNTER — Telehealth: Payer: Self-pay | Admitting: *Deleted

## 2018-07-15 NOTE — Telephone Encounter (Signed)
Called to let her know that we sent in the appeal and it was returned Upheld, still denied, because her insurance does not cover this medication. She is going to call the insurance company to see if there is anything she can do. She was using a copay card which was available to her but has met the lifetime availability these are provided.  She mentioned that she will need to get something else to help her. I told to call us back after she spoke to [employer] and that she and Dr. Tomi Likens can discuss alternatives if any are available, that it would be between her and Dr. Tomi Likens to come up with something.

## 2018-07-19 ENCOUNTER — Telehealth: Payer: Self-pay | Admitting: Neurology

## 2018-07-19 NOTE — Telephone Encounter (Signed)
Patient needs to talk to someone about a different medication please call per VM

## 2018-07-20 NOTE — Telephone Encounter (Signed)
Called and LMOVM for Pt to return my call 

## 2018-08-05 ENCOUNTER — Telehealth: Payer: Self-pay | Admitting: Neurology

## 2018-08-05 MED ORDER — FREMANEZUMAB-VFRM 225 MG/1.5ML ~~LOC~~ SOAJ: 225.0000 mg | SUBCUTANEOUS | 11 refills | Status: DC

## 2018-08-05 NOTE — Telephone Encounter (Signed)
Called and spoke with Pt. I advised her I had LM for her to return my call previously, she had forgotten.  Pt is unable to use another co-pay card with [name], has reached lifetime max. Per Dr. Tomi Likens, [place] to Rx [name].  Sent Pt picture of co-pay card. BIN# [id number] PCN# [id number] GRP# [id number] VO#[phone]

## 2018-08-05 NOTE — Telephone Encounter (Signed)
Patient left VM about trying to get a medication refill. She said she has been trying for 2 weeks. She did not give medication name or pharm. Thanks!

## 2018-10-06 NOTE — Progress Notes (Signed)
NEUROLOGY FOLLOW UP OFFICE NOTE  Karen Davis [id number]  HISTORY OF PRESENT IL8LNESS: Karen Davis is a 48-year-old right-handed woman who follows up for migraines.  UPDATE: Her insurance would not approve [name], so she was switched to [name].  She also has tried Reyvow. Intensity:  Moderate to severe Duration:  2 hours Frequency:  4 a month Frequency of abortive medication:most days a week Current NSAIDS:none Current analgesics:Tylenol Sinus Current triptans:none Current ergotamine:None Current anti-emetic:None Current muscle relaxants:tizandine Current anti-anxiolytic:None Current sleep aide:None Current Antihypertensive medications:None Current Antidepressant medications:None Current Anticonvulsant medications:None Current anti-CGRP:[name] Current Vitamins/Herbal/Supplements:Centrum, iron Current Antihistamines/Decongestants:None Other therapy:Reyvow Hormone/birth control: None  Caffeine:Coffee, tea, soda Diet:hydrates Exercise:Not routine Depression:no; Anxiety:no Other pain:Neck pain Sleep hygiene:ok  HISTORY: Onset:  2013 or 2014 Location: Right maxillary/periorbital region, sometimes radiating to occipital region Quality: Pressure, progressing to stabbing Initial Intensity: 10/10; May: 8/10 Aura: no Prodrome: no Associated symptoms: Nausea, photophobia Initial Duration: 30 minutes with sumatriptan, otherwise several hours; May: Usually 1 hour Initial Frequency: 6 days per month Triggers:  Change in barometric pressure, heat Relieving factors:  None Activity: Needs to lay down  Past NSAIDS: flurbiprofen, oxaprozin, ibuprofen, ketoprofen, etodolac, Mobic Past analgesics: Excedrin, Tylenol Past abortive triptans: sumatriptan 100mg , rizatriptan 10mg  Past muscle relaxants: no Past anti-emetic: no Past anti-anxiolytic: no Past sleep aide: no  Past antihypertensive medications:  verapamil HCL ER 120mg  (was effective but she stopped due to feeling depressed) Past antidepressant medications: Nortriptyline 25mg  Past anticonvulsant medications: Topiramate (effective but caused diarrhea) Past anti-CGRP:  [name] 140mg  (effective but no longer covered by insurance) Past vitamins/Herbal/Supplements: riboflavin Past antihistamines/decongestants: no  Family history of headache: Mother had migraines. Brother had stroke in his 49s. Paternal great grandfather had stroke in his 31s.  MRI and MRA of head and MRA of neck from 11/12/13 were personally reviewed and were unremarkable.  She also reports bilateral hand numbness and tingling that can radiate up to above elbows. They are prominent when in bed and often wakes her up. She denies neck pain or weakness. NCV-EMG was normal. No evidence of carpal tunnel. She does wear wrist splints at night when it flares up and it helps.  She also has chronic low back pain since slipping and falling on her coccyx around 2015. She feels right lower back pain radiating down into her buttocks and associated with numbness and tingling of her anterior thigh. There is no weakness. She had PT for low back pain. Traction helped.  PAST MEDICAL HISTORY: Past Medical History:  Diagnosis Date  . Anemia   . Migraines     MEDICATIONS: Current Outpatient Medications on File Prior to Visit  Medication Sig Dispense Refill  . Erenumab-aooe ([name]) 140 MG/ML SOAJ Inject 140 mg into the skin every 30 (thirty) days. 1 pen 11  . Fremanezumab-vfrm ([name]) 225 MG/1.5ML SOAJ Inject 225 mg into the skin every 30 (thirty) days. 1 pen 11  . Lasmiditan Succinate (REYVOW) 100 MG TABS Take 1 tablet by mouth as needed (Maximum 1 tablet in 24 hours). 30 tablet 3  . Multiple Vitamin (MULTIVITAMIN) tablet Take 1 tablet by mouth daily.    . naproxen (NAPROSYN) 500 MG tablet Take 500-1,000 mg by mouth daily as needed for moderate pain.    . rizatriptan  (MAXALT-MLT) 10 MG disintegrating tablet Take 1 tablet earliest onset of migraine.  May repeat x1 in 2 hours if needed.  Do not exceed 2 tablets in 24h 9 tablet 3  . tiZANidine (ZANAFLEX) 4 MG tablet Take 0.5 tablets (  2 mg total) by mouth at bedtime. 30 tablet 3   No current facility-administered medications on file prior to visit.     ALLERGIES: Allergies  Allergen Reactions  . Vantin [Cefpodoxime] Other (See Comments)    migraine    FAMILY HISTORY: Family History  Problem Relation Age of Onset  . Melanoma Mother   . Liver disease Father   . Stroke Brother    SOCIAL HISTORY: Social History   Socioeconomic History  . Marital status: Married    Spouse name: Not on file  . Number of children: Not on file  . Years of education: Not on file  . Highest education level: Not on file  Occupational History  . Not on file  Social Needs  . Financial resource strain: Not on file  . Food insecurity    Worry: Not on file    Inability: Not on file  . Transportation needs    Medical: Not on file    Non-medical: Not on file  Tobacco Use  . Smoking status: Former Smoker    Quit date: 10/08/2003    Years since quitting: 15.0  . Smokeless tobacco: Never Used  Substance and Sexual Activity  . Alcohol use: No  . Drug use: No  . Sexual activity: Not on file  Lifestyle  . Physical activity    Days per week: Not on file    Minutes per session: Not on file  . Stress: Not on file  Relationships  . Social Herbalist on phone: Not on file    Gets together: Not on file    Attends religious service: Not on file    Active member of club or organization: Not on file    Attends meetings of clubs or organizations: Not on file    Relationship status: Not on file  . Intimate partner violence    Fear of current or ex partner: Not on file    Emotionally abused: Not on file    Physically abused: Not on file    Forced sexual activity: Not on file  Other Topics Concern  . Not on file   Social History Narrative  . Not on file    REVIEW OF SYSTEMS: Constitutional: No fevers, chills, or sweats, no generalized fatigue, change in appetite Eyes: No visual changes, double vision, eye pain Ear, nose and throat: No hearing loss, ear pain, nasal congestion, sore throat Cardiovascular: No chest pain, palpitations Respiratory:  No shortness of breath at rest or with exertion, wheezes GastrointestinaI: No nausea, vomiting, diarrhea, abdominal pain, fecal incontinence Genitourinary:  No dysuria, urinary retention or frequency Musculoskeletal:  No neck pain, back pain Integumentary: No rash, pruritus, skin lesions Neurological: as above Psychiatric: No depression, insomnia, anxiety Endocrine: No palpitations, fatigue, diaphoresis, mood swings, change in appetite, change in weight, increased thirst Hematologic/Lymphatic:  No purpura, petechiae. Allergic/Immunologic: no itchy/runny eyes, nasal congestion, recent allergic reactions, rashes  PHYSICAL EXAM: Blood pressure 132/72, pulse (!) 57, temperature 98 F (36.7 C), temperature source Oral, height 5\' 4"  (1.626 m), weight 176 lb (79.8 kg), SpO2 98 %. General: No acute distress.  Patient appears well-groomed.   Head:  Normocephalic/atraumatic Eyes:  Fundi examined but not visualized Neck: supple, no paraspinal tenderness, full range of motion Heart:  Regular rate and rhythm Lungs:  Clear to auscultation bilaterally Back: No paraspinal tenderness Neurological Exam: alert and oriented to person, place, and time. Attention span and concentration intact, recent and remote memory intact, fund of knowledge intact.  Speech fluent and not dysarthric, language intact.  CN II-XII intact. Bulk and tone normal, muscle strength 5/5 throughout.  Sensation to light touch, temperature and vibration intact.  Deep tendon reflexes 2+ throughout, toes downgoing.  Finger to nose and heel to shin testing intact.  Gait normal, Romberg negative.   IMPRESSION: Migraine without aura, without status migrainosus, not intractable  PLAN: 1.  For preventative management, [name] 2.  For abortive therapy, Reyvow.  Informed patient that she should not drive for 8 hours after taken 3. Tizanidine refilled. 4.  Limit use of pain relievers to no more than 2 days out of week to prevent risk of rebound or medication-overuse headache. 5.  Keep headache diary 6.  Exercise, hydration, caffeine cessation, sleep hygiene, monitor for and avoid triggers 7.  Consider:  magnesium citrate 400mg  daily, riboflavin 400mg  daily, and coenzyme Q10 100mg  three times daily 8. Always keep in mind that currently taking a hormone or birth control may be a possible trigger or aggravating factor for migraine. 9. Follow up 6 months   Metta Clines, DO  CC: Dibas Dorthy Cooler, MD

## 2018-10-07 ENCOUNTER — Encounter: Payer: Self-pay | Admitting: Neurology

## 2018-10-07 ENCOUNTER — Ambulatory Visit (INDEPENDENT_AMBULATORY_CARE_PROVIDER_SITE_OTHER): Payer: BLUE CROSS/BLUE SHIELD | Admitting: Neurology

## 2018-10-07 ENCOUNTER — Other Ambulatory Visit: Payer: Self-pay

## 2018-10-07 VITALS — BP 132/72 | HR 57 | Temp 98.0°F | Ht 64.0 in | Wt 176.0 lb

## 2018-10-07 DIAGNOSIS — G43009 Migraine without aura, not intractable, without status migrainosus: Secondary | ICD-10-CM | POA: Diagnosis not present

## 2018-10-07 MED ORDER — TIZANIDINE HCL 4 MG PO TABS: 2.0000 mg | ORAL_TABLET | Freq: Every day | ORAL | 3 refills | Status: DC

## 2018-10-07 NOTE — Patient Instructions (Signed)
1.  Continue [name] monthly 2.  When you get a migraine, take Reyvow as directed.  Recommended not to drive for 8 hours afterward 3. Tizanidine refilled 4.  Limit use of pain relievers to no more than 2 days out of week to prevent risk of rebound or medication-overuse headache. 5.  Keep headache diary 6.  Exercise, hydration, caffeine cessation, sleep hygiene, monitor for and avoid triggers 7.  Consider:  magnesium citrate 400mg  daily, riboflavin 400mg  daily, and coenzyme Q10 100mg  three times daily 8. Always keep in mind that currently taking a hormone or birth control may be a possible trigger or aggravating factor for migraine. 9. Follow up in 6 months.

## 2018-12-29 ENCOUNTER — Telehealth: Payer: Self-pay | Admitting: Neurology

## 2018-12-29 MED ORDER — EMGALITY 120 MG/ML ~~LOC~~ SOAJ: 240.0000 mg | Freq: Once | SUBCUTANEOUS | 0 refills | Status: AC

## 2018-12-29 MED ORDER — EMGALITY 120 MG/ML ~~LOC~~ SOAJ: 120.0000 mg | SUBCUTANEOUS | 11 refills | Status: DC

## 2018-12-29 NOTE — Telephone Encounter (Signed)
See below

## 2018-12-29 NOTE — Telephone Encounter (Signed)
She responded to [name] 140mg  every 30 days but her insurance stopped covering it.  Did she try the [name] savings card (she can get [name] cheap even if not approved by insurance)?

## 2018-12-29 NOTE — Telephone Encounter (Signed)
Patient left a message on the Voice message   She states that the ajovy is not working she is having headache 2 or 3 days a week would like to know if there is anything else she can take  Please call

## 2018-12-29 NOTE — Telephone Encounter (Signed)
Is she willing to try Emgality, which is another CGRP inhibitor injection?  Otherwise, we can contact the rep for [name]. In the past, they said that they have gotten people to remain on the savings card after 12 months.

## 2018-12-29 NOTE — Telephone Encounter (Signed)
Spoke with patient she states that the saving card is only good for 12 months which she has max out plus her insurance would not cover it. She was informed of provider response requesting something else.

## 2018-12-29 NOTE — Telephone Encounter (Signed)
Spoke with patient she would like to do start Emgality  Will send Loading dose and normal dose to  [place]   Pt aware that this may required PA

## 2019-01-03 ENCOUNTER — Encounter: Payer: Self-pay | Admitting: *Deleted

## 2019-01-03 NOTE — Progress Notes (Addendum)
Karen Davis (Key: [id number]) Rx #: [id number] Emgality 120MG /ML auto-injectors (migraine)   Form [facility] Electronic Prior Authorization Form (2017 NCPDP) Created 9 days ago Sent to Plan 4 days ago Plan Response 4 days ago Submit Clinical Questions 4 days ago Determination Unfavorable 22 hours ago Prior authorization for [employer] has been denied. RETURN TO DASHBOARD You may complete an appeal and request a re-evaluation of the coverage determination for this patient. For patients not covered by Medicare or Medicaid, information about how to complete an appeal for such a patient will be sent to you shortly. If you would like to start an appeal for a patient not covered by Medicare or Medicaid, please call CoverMyMeds at [phone].  Message from plan: Request Reference Number: [id number]. EMGALITY INJ 120MG /ML is denied for not meeting the prior authorization requirement(s). For further questions, call [phone]. Appeals are not supported through [facility]. Please refer to the fax case notice for appeals information and instructions.

## 2019-01-06 ENCOUNTER — Telehealth: Payer: Self-pay | Admitting: Neurology

## 2019-01-06 MED ORDER — TOPIRAMATE 25 MG PO TABS: ORAL_TABLET | ORAL | 0 refills | Status: DC

## 2019-01-06 NOTE — Telephone Encounter (Signed)
Patient called and said [facility] denied her Emgality prescription. She said it will need prior authorization with [facility].

## 2019-01-06 NOTE — Telephone Encounter (Signed)
All Notes  Progress Notes by Azalee Course at 01/03/2019 5:01 PM Author: Azalee Course Author Type:  Filed: 01/03/2019 5:29 PM  Note Status: Signed Cosign: Cosign Not Required Encounter Date: 01/03/2019  Editor: Glenetta Borg (Key: [id number]) Rx #: [id number] Emgality 120MG /ML auto-injectors (migraine)   Form [facility] Electronic Prior Authorization Form (2017 NCPDP) Created 5 days ago Sent to Plan 28 minutes ago Plan Response 28 minutes ago Submit Clinical Questions less than a minute ago Determination Wait for Determination Please wait for [facility] 2017 NCPDP to return a determination.

## 2019-01-06 NOTE — Progress Notes (Addendum)
RX FOR EMGALITY 120 MG INJECTION DENIED  RECEIVED LETTER  SENT TO BE SCANNED

## 2019-01-06 NOTE — Addendum Note (Signed)
Addended by: Ranae Plumber on: 01/06/2019 03:20 PM   Modules accepted: Orders

## 2019-01-06 NOTE — Telephone Encounter (Signed)
Start topiramate 25mg  at bedtime for one week, then 50mg  at bedtime.  At end of prescription (4 or 5 days left), she should contact us with update and we can increase dose if needed.

## 2019-01-06 NOTE — Telephone Encounter (Signed)
Pt states that Rx was DENIED she states that [name] is not working, she has tried [name]. She has max out her 12 month co pay card  She is requesting to go back topamax   Ok to send to local pharmacy.   Karen Davis

## 2019-01-06 NOTE — Telephone Encounter (Signed)
Called spoke with patient she was informed of Rx and to call office 4 to 5 days before she is out with update we can increase if need be.

## 2019-01-26 NOTE — Progress Notes (Signed)
Pt is no longer taken this medication. She has started Topamax

## 2019-01-31 ENCOUNTER — Other Ambulatory Visit: Payer: Self-pay

## 2019-01-31 DIAGNOSIS — Z20822 Contact with and (suspected) exposure to covid-19: Secondary | ICD-10-CM

## 2019-02-01 LAB — NOVEL CORONAVIRUS, NAA: SARS-CoV-2, NAA: NOT DETECTED

## 2019-02-16 ENCOUNTER — Other Ambulatory Visit: Payer: Self-pay | Admitting: Neurology

## 2019-02-16 ENCOUNTER — Other Ambulatory Visit: Payer: Self-pay

## 2019-02-16 MED ORDER — TOPIRAMATE 25 MG PO TABS: ORAL_TABLET | ORAL | 1 refills | Status: DC

## 2019-02-16 NOTE — Telephone Encounter (Signed)
Patient called and left a message stating she is doing fine on 25 MG topiramate. She then said she is ready to double the prescription to 50 MG but will need a new prescription sent to the pharmacy below. Patient is just about completely out of the medicine.  Kristopher Oppenheim in [facility]

## 2019-02-16 NOTE — Telephone Encounter (Signed)
Requested Prescriptions   Pending Prescriptions Disp Refills  . topiramate (TOPAMAX) 25 MG tablet 180 tablet 1    Sig: Take 2 tablets (total 50 mg) at bedtime   Rx sent

## 2019-02-16 NOTE — Telephone Encounter (Signed)
Pieter Partridge, DO to Me     01/06/19 3:04 PM Note   Start topiramate 25mg  at bedtime for one week, then 50mg  at bedtime.  At end of prescription (4 or 5 days left), she should contact us with update and we can increase dose if needed.

## 2019-04-11 ENCOUNTER — Encounter: Payer: Self-pay | Admitting: Neurology

## 2019-04-11 NOTE — Progress Notes (Signed)
Virtual Visit via Video Note The purpose of this virtual visit is to provide medical care while limiting exposure to the novel coronavirus.    Consent was obtained for video visit:  Yes.   Answered questions that patient had about telehealth interaction:  Yes.   I discussed the limitations, risks, security and privacy concerns of performing an evaluation and management service by telemedicine. I also discussed with the patient that there may be a patient responsible charge related to this service. The patient expressed understanding and agreed to proceed.  Pt location: Home Physician Location: office Name of referring provider:  Lujean Amel, MD I connected with Karen Davis at patients initiation/request on 04/12/2019 at  8:50 AM EST by video enabled telemedicine application and verified that I am speaking with the correct person using two identifiers. Pt MRN:  [id number] Pt DOB:  [birth year] Video Participants:  Karen Davis   History of Present Illness:  Karen Davis is a 49 year old right-handed woman who follows up for migraines.  UPDATE: Her insurance would not approve [name] (despite being effective) or Emgality.  [name] was ineffective.  She was started on topiramate in November.  Migraines have improved. Intensity:  Moderate to severe Duration:  2 hours Frequency:  She has had 2 migraines over past 30 days. Frequency of abortive medication:most days a week Current NSAIDS:none Current analgesics:Tylenol Sinus Current triptans:none Current ergotamine:None Current anti-emetic:None Current muscle relaxants:tizandine Current anti-anxiolytic:None Current sleep aide:None Current Antihypertensive medications:None Current Antidepressant medications:None Current Anticonvulsant medications:topiramate 50mg  at bedtime Current anti-CGRP:none Current Vitamins/Herbal/Supplements:Centrum, iron Current Antihistamines/Decongestants:None Other  therapy:Reyvow Hormone/birth control: None  Caffeine:Coffee, tea, soda Diet:hydrates Exercise:Not routine Depression:no; Anxiety:no Other pain:Neck pain Sleep hygiene:ok  HISTORY: Onset:  2013 or 2014 Location: Right maxillary/periorbital region, sometimes radiating to occipital region Quality: Pressure, progressing to stabbing Initial Intensity: 10/10; May: 8/10 Aura: no Prodrome: no Associated symptoms: Nausea, photophobia Initial Duration: 30 minutes with sumatriptan, otherwise several hours; May: Usually 1 hour Initial Frequency: 6 days per month Triggers:  Change in barometric pressure, heat Relieving factors:  None Activity: Needs to lay down  Past NSAIDS: flurbiprofen, oxaprozin, ibuprofen, ketoprofen, etodolac, Mobic Past analgesics: Excedrin, Tylenol Past abortive triptans: sumatriptan 100mg , rizatriptan 10mg  Past muscle relaxants: no Past anti-emetic: no Past anti-anxiolytic: no Past sleep aide: no  Past antihypertensive medications: verapamil HCL ER 120mg  (was effective but she stopped due to feeling depressed) Past antidepressant medications: Nortriptyline 25mg  Past anticonvulsant medications: Topiramate (effective but caused diarrhea) Past anti-CGRP:  [name] 140mg  (effective but no longer covered by insurance); [name] (ineffective) Past vitamins/Herbal/Supplements: riboflavin Past antihistamines/decongestants: no  Family history of headache: Mother had migraines. Brother had stroke in his 34s. Paternal great grandfather had stroke in his 71s.  MRI and MRA of head and MRA of neck from 11/12/13 were personally reviewed and were unremarkable.  She also reports bilateral hand numbness and tingling that can radiate up to above elbows. They are prominent when in bed and often wakes her up. She denies neck pain or weakness. NCV-EMG was normal. No evidence of carpal tunnel. She does wear wrist splints at night when it  flares up and it helps.  She also has chronic low back pain since slipping and falling on her coccyx around 2015. She feels right lower back pain radiating down into her buttocks and associated with numbness and tingling of her anterior thigh. There is no weakness. She had PT for low back pain. Traction helped.  Past Medical History: Past Medical History:  Diagnosis Date  .  Anemia   . Migraines     Medications: Outpatient Encounter Medications as of 04/12/2019  Medication Sig  . Galcanezumab-gnlm (EMGALITY) 120 MG/ML SOAJ Inject 120 mg into the skin every 30 (thirty) days.  [place] Succinate (REYVOW) 100 MG TABS Take 1 tablet by mouth as needed (Maximum 1 tablet in 24 hours).  . Multiple Vitamin (MULTIVITAMIN) tablet Take 1 tablet by mouth daily.  . rizatriptan (MAXALT-MLT) 10 MG disintegrating tablet Take 1 tablet earliest onset of migraine.  May repeat x1 in 2 hours if needed.  Do not exceed 2 tablets in 24h (Patient not taking: Reported on 10/07/2018)  . tiZANidine (ZANAFLEX) 4 MG tablet Take 0.5 tablets (2 mg total) by mouth at bedtime.  . topiramate (TOPAMAX) 25 MG tablet Take 2 tablets (total 50 mg) at bedtime   No facility-administered encounter medications on file as of 04/12/2019.    Allergies: Allergies  Allergen Reactions  . Vantin [Cefpodoxime] Other (See Comments)    migraine    Family History: Family History  Problem Relation Age of Onset  . Melanoma Mother   . Liver disease Father   . Stroke Brother     Social History: Social History   Socioeconomic History  . Marital status: Married    Spouse name: Dellis Filbert  . Number of children: Not on file  . Years of education: Not on file  . Highest education level: Not on file  Occupational History    Employer: [employer]  Tobacco Use  . Smoking status: Former Smoker    Quit date: 10/08/2003    Years since quitting: 15.5  . Smokeless tobacco: Never Used  Substance and Sexual Activity  . Alcohol use:  No  . Drug use: No  . Sexual activity: Not on file  Other Topics Concern  . Not on file  Social History Narrative  . Not on file   Social Determinants of Health   Financial Resource Strain:   . Difficulty of Paying Living Expenses: Not on file  Food Insecurity:   . Worried About Charity fundraiser in the Last Year: Not on file  . Ran Out of Food in the Last Year: Not on file  Transportation Needs:   . Lack of Transportation (Medical): Not on file  . Lack of Transportation (Non-Medical): Not on file  Physical Activity:   . Days of Exercise per Week: Not on file  . Minutes of Exercise per Session: Not on file  Stress:   . Feeling of Stress : Not on file  Social Connections:   . Frequency of Communication with Friends and Family: Not on file  . Frequency of Social Gatherings with Friends and Family: Not on file  . Attends Religious Services: Not on file  . Active Member of Clubs or Organizations: Not on file  . Attends Archivist Meetings: Not on file  . Marital Status: Not on file  Intimate Partner Violence:   . Fear of Current or Ex-Partner: Not on file  . Emotionally Abused: Not on file  . Physically Abused: Not on file  . Sexually Abused: Not on file    Observations/Objective:   Height 5\' 4"  (1.626 m), weight 180 lb (81.6 kg). No acute distress.  Alert and oriented.  Speech fluent and not dysarthric.  Language intact.  Eyes orthophoric on primary gaze.  Face symmetric.  Assessment and Plan:   Migraine without aura, without status migrainosus, not intractable, improved on topiramate  1.  For preventative management, topiramate  50mg  at bedtime 2.  For abortive therapy, Reyvow. 3.  Tizanidine at bedtime for neck pain. 4.  Limit use of pain relievers to no more than 2 days out of week to prevent risk of rebound or medication-overuse headache. 5.  Keep headache diary 6.  Exercise, hydration, caffeine cessation, sleep hygiene, monitor for and avoid  triggers 7. Follow up 6 months.   Follow Up Instructions:    -I discussed the assessment and treatment plan with the patient. The patient was provided an opportunity to ask questions and all were answered. The patient agreed with the plan and demonstrated an understanding of the instructions.   The patient was advised to call back or seek an in-person evaluation if the symptoms worsen or if the condition fails to improve as anticipated.    Dudley Major, DO

## 2019-04-12 ENCOUNTER — Other Ambulatory Visit: Payer: Self-pay

## 2019-04-12 ENCOUNTER — Telehealth (INDEPENDENT_AMBULATORY_CARE_PROVIDER_SITE_OTHER): Payer: BLUE CROSS/BLUE SHIELD | Admitting: Neurology

## 2019-04-12 ENCOUNTER — Encounter: Payer: Self-pay | Admitting: Neurology

## 2019-04-12 VITALS — Ht 64.0 in | Wt 180.0 lb

## 2019-04-12 DIAGNOSIS — G43009 Migraine without aura, not intractable, without status migrainosus: Secondary | ICD-10-CM | POA: Diagnosis not present

## 2019-08-09 ENCOUNTER — Other Ambulatory Visit: Payer: Self-pay | Admitting: Neurology

## 2019-09-12 ENCOUNTER — Ambulatory Visit (INDEPENDENT_AMBULATORY_CARE_PROVIDER_SITE_OTHER): Payer: BLUE CROSS/BLUE SHIELD | Admitting: Family Medicine

## 2019-09-12 ENCOUNTER — Other Ambulatory Visit: Payer: Self-pay

## 2019-09-12 ENCOUNTER — Encounter: Payer: Self-pay | Admitting: Family Medicine

## 2019-09-12 VITALS — BP 130/82 | HR 67 | Temp 98.4°F | Ht 64.0 in | Wt 189.0 lb

## 2019-09-12 DIAGNOSIS — G4700 Insomnia, unspecified: Secondary | ICD-10-CM | POA: Diagnosis not present

## 2019-09-12 DIAGNOSIS — Z13220 Encounter for screening for lipoid disorders: Secondary | ICD-10-CM

## 2019-09-12 DIAGNOSIS — R232 Flushing: Secondary | ICD-10-CM

## 2019-09-12 DIAGNOSIS — R5383 Other fatigue: Secondary | ICD-10-CM | POA: Diagnosis not present

## 2019-09-12 DIAGNOSIS — Z1231 Encounter for screening mammogram for malignant neoplasm of breast: Secondary | ICD-10-CM

## 2019-09-12 DIAGNOSIS — G8929 Other chronic pain: Secondary | ICD-10-CM

## 2019-09-12 DIAGNOSIS — M5441 Lumbago with sciatica, right side: Secondary | ICD-10-CM

## 2019-09-12 DIAGNOSIS — G43909 Migraine, unspecified, not intractable, without status migrainosus: Secondary | ICD-10-CM | POA: Diagnosis not present

## 2019-09-12 DIAGNOSIS — D649 Anemia, unspecified: Secondary | ICD-10-CM

## 2019-09-12 DIAGNOSIS — Z131 Encounter for screening for diabetes mellitus: Secondary | ICD-10-CM

## 2019-09-12 NOTE — Progress Notes (Signed)
VERNER KOPISCHKE DOB: [birth year] Encounter date: 09/12/2019  This is a 49 y.o. female who presents to establish care.   History of present illness: Transferring care from [place].  History of depression. Was hospitalized 20+ yrs ago and put on lithium and weaned off and hasn't been on anything since. Daughter just turned 47, son is going off to college. Has noted that short term memory has not been there. More tired, emotional struggle. Has always watched this. Has had issues with anemia in past. Now feels more emotional, more of a struggle. Husband left right before COVID and has come back. Had bloodwork a year ago. Has been this way for awhile.   Stress at work.   Just started watching calories, working on getting weight off.  Arthritis: just released from rheumatologist. Was taking diclofenac. Just had left shoulder surgery and so still working with this. Still with some arthritis in left shoulder - had bone shaved. Tennis elbows have resolved (this was reason for seeing rheum). Hasn't returned since stopping diclofenac 3 mo ago.   Currently in PT for lower right back - sciatica.   Has gained 40lbs in last year. Not eating as well, not as active. Getting hot flashes; feels hotter with sugar intake.   Headache/migraine - seem more sinus and barometric pressure related. Always start in right sinus area and has been to [facility] and has seen Dr. Ardis Hughs and has had the all clear. Has seen allergy; has no allergies. Mostly notes when pressure systems come through and if anxiety is building up. Takes topamax nightly. Hoping to go back on aimovig because this worked well for her. Sees Dr. Tomi Likens for these.  In the past, was on verapamil for prevention but felt this caused depression/fogginess.  She is used sumatriptan for important relief.  She did not feel like meloxicam was helpful for aborting headaches.  Nortriptyline was also tried in the past, was stopped only due to consideration  for medication trial she wanted to enter.  She has had brain scan with migraines and due to fam hx of brother with premature stroke (involved fall and some question as to how stroke developed).   Difficulty falling asleep in last week; staying asleep has always been hard.   Everything is a struggle to do. When she was hospitalized she was diagnosed with bipolar disorder.   Past Medical History:  Diagnosis Date  . Alcohol abuse   . Anemia   . Depression   . Migraines    Past Surgical History:  Procedure Laterality Date  . FINGER SURGERY Right    pinkie   . KNEE SURGERY Right    x2  . left shoulder surgery    . PARTIAL HYSTERECTOMY     left ovary remains; hysterectomy for ovarian and menorrhagia  . PILONIDAL CYST EXCISION    . TEAR DUCT PROBING Left    Allergies  Allergen Reactions  . Vantin [Cefpodoxime] Other (See Comments)    migraine   Current Meds  Medication Sig  . Ascorbic Acid (VITAMIN C PO) Take 500 mg by mouth daily.  . influenza vac split quadrivalent PF (FLUZONE QUADRIVALENT) 0.5 ML injection Fluzone Quad 2019-2020 (PF) 60 mcg (15 mcg x 4)/0.5 mL IM syringe  TO BE ADMINISTERED BY PHARMACIST FOR IMMUNIZATION  . Lasmiditan Succinate (REYVOW) 100 MG TABS Take 1 tablet by mouth as needed (Maximum 1 tablet in 24 hours).  Liz Beach Succinate (REYVOW) 100 MG TABS Reyvow 100 mg tablet  . Multiple  Vitamin (MULTIVITAMIN) tablet Take 1 tablet by mouth daily.  Marland Kitchen tiZANidine (ZANAFLEX) 4 MG tablet TAKE ONE-HALF TABLET BY MOUTH EVERY NIGHT AT BEDTIME  . topiramate (TOPAMAX) 25 MG tablet Take 2 tablets (total 50 mg) at bedtime   Social History   Tobacco Use  . Smoking status: Former Smoker    Packs/day: 1.00    Years: 20.00    Pack years: 20.00    Quit date: 10/08/2003    Years since quitting: 15.9  . Smokeless tobacco: Never Used  Substance Use Topics  . Alcohol use: No   Family History  Problem Relation Age of Onset  . Melanoma Mother   . Liver disease Father    . Depression Father   . High blood pressure Father   . High Cholesterol Father   . Alcohol abuse Father   . Stroke Brother 86  . Cancer Brother        skin  . Hearing loss Paternal Grandfather   . Skin cancer Maternal Grandmother   . Leukemia Maternal Grandfather   . Arthritis Paternal Grandmother   . Hearing loss Paternal Grandmother      Review of Systems  Constitutional: Negative for chills, fatigue and fever.  Respiratory: Negative for cough, chest tightness, shortness of breath and wheezing.   Cardiovascular: Negative for chest pain, palpitations and leg swelling.  Musculoskeletal: Positive for arthralgias.  Neurological: Positive for headaches.  Psychiatric/Behavioral: The patient is nervous/anxious.        See hpi    Objective:  BP 130/82 (BP Location: Right Arm, Patient Position: Sitting, Cuff Size: Large)   Pulse 67   Temp 98.4 F (36.9 C) (Oral)   Ht 5\' 4"  (1.626 m)   Wt 189 lb (85.7 kg)   BMI 32.44 kg/m   Weight: 189 lb (85.7 kg)   BP Readings from Last 3 Encounters:  09/12/19 130/82  10/07/18 132/72  02/03/18 112/82   Wt Readings from Last 3 Encounters:  09/12/19 189 lb (85.7 kg)  04/11/19 180 lb (81.6 kg)  10/07/18 176 lb (79.8 kg)    Physical Exam Constitutional:      General: She is not in acute distress.    Appearance: She is well-developed.  Cardiovascular:     Rate and Rhythm: Normal rate and regular rhythm.     Heart sounds: Normal heart sounds. No murmur heard.  No friction rub.  Pulmonary:     Effort: Pulmonary effort is normal. No respiratory distress.     Breath sounds: Normal breath sounds. No wheezing or rales.  Musculoskeletal:     Right lower leg: No edema.     Left lower leg: No edema.  Neurological:     Mental Status: She is alert and oriented to person, place, and time.  Psychiatric:        Attention and Perception: Attention normal.        Mood and Affect: Mood normal.        Speech: Speech normal.        Behavior:  Behavior normal.        Cognition and Memory: Cognition normal.     Assessment/Plan:  1. Chronic low back pain with right-sided sciatica, unspecified back pain laterality Currently working with physical therapy.  Some improvement.  2. Migraine without status migrainosus, not intractable, unspecified migraine type Following with neurology regularly.  Feels that she does best on [name], but has had difficulty with coverage for this.  3. Insomnia, unspecified type Chronic problem.  Not  currently on medication to help with sleep.  4. Fatigue, unspecified type We will start with blood work and consider further evaluation pending this. - TSH; Future - T4, free; Future - T3, free; Future - VITAMIN D 25 Hydroxy (Vit-D Deficiency, Fractures); Future - Iron, TIBC and Ferritin Panel; Future  5. Lipid screening - Lipid panel; Future  6. Screening for diabetes mellitus - Comprehensive metabolic panel; Future - Hemoglobin A1c; Future  7. Hot flashes - Estradiol; Future  8. Anemia, unspecified type  - CBC with Differential/Platelet; Future - Vitamin B12; Future - Iron, TIBC and Ferritin Panel; Future  9. Encounter for screening mammogram for malignant neoplasm of breast - MM DIGITAL SCREENING BILATERAL; Future  Return for pending bloodwork.  Micheline Rough, MD

## 2019-09-15 ENCOUNTER — Encounter: Payer: Self-pay | Admitting: Family Medicine

## 2019-09-16 ENCOUNTER — Other Ambulatory Visit: Payer: Self-pay

## 2019-09-16 ENCOUNTER — Other Ambulatory Visit (INDEPENDENT_AMBULATORY_CARE_PROVIDER_SITE_OTHER): Payer: BLUE CROSS/BLUE SHIELD

## 2019-09-16 DIAGNOSIS — R5383 Other fatigue: Secondary | ICD-10-CM

## 2019-09-16 DIAGNOSIS — Z13220 Encounter for screening for lipoid disorders: Secondary | ICD-10-CM

## 2019-09-16 DIAGNOSIS — R232 Flushing: Secondary | ICD-10-CM

## 2019-09-16 DIAGNOSIS — Z131 Encounter for screening for diabetes mellitus: Secondary | ICD-10-CM

## 2019-09-16 DIAGNOSIS — D649 Anemia, unspecified: Secondary | ICD-10-CM | POA: Insufficient documentation

## 2019-09-16 MED ORDER — DICLOFENAC SODIUM 75 MG PO TBEC: 75.0000 mg | DELAYED_RELEASE_TABLET | Freq: Two times a day (BID) | ORAL | Status: DC | PRN

## 2019-09-17 LAB — VITAMIN D 25 HYDROXY (VIT D DEFICIENCY, FRACTURES): Vit D, 25-Hydroxy: 31 ng/mL (ref 30–100)

## 2019-09-17 LAB — CBC WITH DIFFERENTIAL/PLATELET
Absolute Monocytes: 340 cells/uL (ref 200–950)
Basophils Absolute: 19 cells/uL (ref 0–200)
Basophils Relative: 0.3 %
Eosinophils Absolute: 38 cells/uL (ref 15–500)
Eosinophils Relative: 0.6 %
HCT: 43.8 % (ref 35.0–45.0)
Hemoglobin: 13.6 g/dL (ref 11.7–15.5)
Lymphs Abs: 2363 cells/uL (ref 850–3900)
MCH: 26.9 pg — ABNORMAL LOW (ref 27.0–33.0)
MCHC: 31.1 g/dL — ABNORMAL LOW (ref 32.0–36.0)
MCV: 86.6 fL (ref 80.0–100.0)
MPV: 10 fL (ref 7.5–12.5)
Monocytes Relative: 5.4 %
Neutro Abs: 3541 cells/uL (ref 1500–7800)
Neutrophils Relative %: 56.2 %
Platelets: 361 10*3/uL (ref 140–400)
RBC: 5.06 10*6/uL (ref 3.80–5.10)
RDW: 14.6 % (ref 11.0–15.0)
Total Lymphocyte: 37.5 %
WBC: 6.3 10*3/uL (ref 3.8–10.8)

## 2019-09-17 LAB — COMPREHENSIVE METABOLIC PANEL
AG Ratio: 1.8 (calc) (ref 1.0–2.5)
ALT: 25 U/L (ref 6–29)
AST: 19 U/L (ref 10–35)
Albumin: 4.4 g/dL (ref 3.6–5.1)
Alkaline phosphatase (APISO): 76 U/L (ref 31–125)
BUN: 16 mg/dL (ref 7–25)
CO2: 27 mmol/L (ref 20–32)
Calcium: 9.4 mg/dL (ref 8.6–10.2)
Chloride: 108 mmol/L (ref 98–110)
Creat: 0.63 mg/dL (ref 0.50–1.10)
Globulin: 2.5 g/dL (calc) (ref 1.9–3.7)
Glucose, Bld: 99 mg/dL (ref 65–99)
Potassium: 4.4 mmol/L (ref 3.5–5.3)
Sodium: 141 mmol/L (ref 135–146)
Total Bilirubin: 0.4 mg/dL (ref 0.2–1.2)
Total Protein: 6.9 g/dL (ref 6.1–8.1)

## 2019-09-17 LAB — HEMOGLOBIN A1C
Hgb A1c MFr Bld: 5.7 % of total Hgb — ABNORMAL HIGH (ref <5.7)
Mean Plasma Glucose: 117 (calc)
eAG (mmol/L): 6.5 (calc)

## 2019-09-17 LAB — ESTRADIOL: Estradiol: <15 pg/mL

## 2019-09-17 LAB — VITAMIN B12: Vitamin B-12: 479 pg/mL (ref 200–1100)

## 2019-09-17 LAB — T3, FREE: T3, Free: 3 pg/mL (ref 2.3–4.2)

## 2019-09-17 LAB — IRON,TIBC AND FERRITIN PANEL
%SAT: 19 % (calc) (ref 16–45)
Ferritin: 32 ng/mL (ref 16–232)
Iron: 67 ug/dL (ref 40–190)
TIBC: 359 mcg/dL (calc) (ref 250–450)

## 2019-09-17 LAB — LIPID PANEL
Cholesterol: 201 mg/dL — ABNORMAL HIGH (ref <200)
HDL: 73 mg/dL (ref >=50)
LDL Cholesterol (Calc): 112 mg/dL (calc) — ABNORMAL HIGH
Non-HDL Cholesterol (Calc): 128 mg/dL (calc) (ref <130)
Total CHOL/HDL Ratio: 2.8 (calc) (ref <5.0)
Triglycerides: 75 mg/dL (ref <150)

## 2019-09-17 LAB — TSH: TSH: 1.64 mIU/L

## 2019-09-17 LAB — T4, FREE: Free T4: 1.2 ng/dL (ref 0.8–1.8)

## 2019-10-05 ENCOUNTER — Ambulatory Visit
Admission: RE | Admit: 2019-10-05 | Discharge: 2019-10-05 | Disposition: A | Discharge status: Home / Self Care | Payer: BLUE CROSS/BLUE SHIELD | Source: Ambulatory Visit | Attending: Family Medicine | Admitting: Family Medicine

## 2019-10-05 ENCOUNTER — Other Ambulatory Visit: Payer: Self-pay

## 2019-10-05 DIAGNOSIS — Z1231 Encounter for screening mammogram for malignant neoplasm of breast: Secondary | ICD-10-CM

## 2019-10-12 ENCOUNTER — Other Ambulatory Visit: Payer: Self-pay | Admitting: Neurology

## 2019-11-09 DIAGNOSIS — M19012 Primary osteoarthritis, left shoulder: Secondary | ICD-10-CM | POA: Diagnosis not present

## 2019-11-11 ENCOUNTER — Other Ambulatory Visit: Payer: Self-pay | Admitting: Neurology

## 2019-11-25 DIAGNOSIS — M7542 Impingement syndrome of left shoulder: Secondary | ICD-10-CM | POA: Diagnosis not present

## 2019-11-25 DIAGNOSIS — M25512 Pain in left shoulder: Secondary | ICD-10-CM | POA: Diagnosis not present

## 2019-11-25 DIAGNOSIS — M19012 Primary osteoarthritis, left shoulder: Secondary | ICD-10-CM | POA: Diagnosis not present

## 2019-12-21 ENCOUNTER — Other Ambulatory Visit: Payer: Self-pay | Admitting: Neurology

## 2019-12-21 ENCOUNTER — Telehealth: Payer: Self-pay | Admitting: Neurology

## 2019-12-21 DIAGNOSIS — G43009 Migraine without aura, not intractable, without status migrainosus: Secondary | ICD-10-CM

## 2019-12-21 MED ORDER — AIMOVIG 140 MG/ML ~~LOC~~ SOAJ: 140.0000 mg | SUBCUTANEOUS | 5 refills | Status: DC

## 2019-12-21 NOTE — Telephone Encounter (Signed)
That is okay.  She was taking [name] 140mg  every 30 days.  May include 5 refills.  [facility] is not listed as one of her pharmacies

## 2019-12-21 NOTE — Telephone Encounter (Signed)
Pt advised and script sent into [facility].

## 2019-12-21 NOTE — Telephone Encounter (Signed)
Telephone call to pt, Per chart pt hasn't  renewed for [name] or Emaglity. Pt due for a 6 months f/u visit.  Pt states her new insurance will cover the injections.   Please advise

## 2019-12-21 NOTE — Telephone Encounter (Signed)
Patient needs refill of Amovig sent to [facility] pharmacy. She has new insurance now and they need a new prescription sent in.

## 2019-12-22 ENCOUNTER — Telehealth: Payer: Self-pay | Admitting: Neurology

## 2019-12-22 ENCOUNTER — Other Ambulatory Visit: Payer: Self-pay

## 2019-12-22 ENCOUNTER — Encounter: Payer: Self-pay | Admitting: Neurology

## 2019-12-22 MED ORDER — TIZANIDINE HCL 4 MG PO TABS: ORAL_TABLET | ORAL | 0 refills | Status: DC

## 2019-12-22 NOTE — Progress Notes (Signed)
Approval valid from 11/22/19 to 12/21/20 for the [name] 140mg .

## 2019-12-28 ENCOUNTER — Ambulatory Visit: Payer: BLUE CROSS/BLUE SHIELD | Admitting: Family Medicine

## 2020-03-07 ENCOUNTER — Ambulatory Visit (INDEPENDENT_AMBULATORY_CARE_PROVIDER_SITE_OTHER): Payer: BC Managed Care – PPO | Admitting: Family Medicine

## 2020-03-07 ENCOUNTER — Encounter: Payer: Self-pay | Admitting: Family Medicine

## 2020-03-07 ENCOUNTER — Other Ambulatory Visit: Payer: Self-pay

## 2020-03-07 VITALS — BP 128/82 | HR 63 | Temp 98.0°F | Ht 64.0 in | Wt 180.6 lb

## 2020-03-07 DIAGNOSIS — G43909 Migraine, unspecified, not intractable, without status migrainosus: Secondary | ICD-10-CM

## 2020-03-07 DIAGNOSIS — R232 Flushing: Secondary | ICD-10-CM

## 2020-03-07 DIAGNOSIS — G4700 Insomnia, unspecified: Secondary | ICD-10-CM

## 2020-03-07 MED ORDER — VENLAFAXINE HCL ER 37.5 MG PO CP24
37.5000 mg | ORAL_CAPSULE | Freq: Every day | ORAL | 0 refills | Status: DC
Start: 2020-03-07 — End: 2020-06-07

## 2020-03-07 NOTE — Patient Instructions (Signed)
Let me know through mychart how you are doing with hot flashes on the effexor in about 2-3 weeks. We can adjust dose from there if needed.

## 2020-03-07 NOTE — Progress Notes (Signed)
Karen Davis DOB: May 08, 1970 Encounter date: 03/07/2020  This is a 50 y.o. female who presents with Chief Complaint  Patient presents with  . Follow-up    History of present illness: Last visit was 09/12/19:  Iron stores were low; suggested iron replacement.  She was also prediabetic.   Low back pain - has determined that issues were posture and strength related. Has new desk, new chair, box under desk for good posture. Has been doing exercises from therapy.  Migraine - following with neurology Insomnia - still not sleeping well.  Depressed mood - mood has been doing well. Feels she is handling lifes stressors well.  Even though post menopausal, still getting hot flashes. Sweating, neck and face. Has been getting these for a long time. Keeps fan by her. Just has adjusted to these flashes. Having at least 30 hot flashes/day.  She has been this way for years.  Not getting worse, but also not improving.  Did have colonoscopy but it has been over 10 years ago.  Mammogram normal from 10/2019.   Allergies  Allergen Reactions  . Vantin [Cefpodoxime] Other (See Comments)    migraine   Current Meds  Medication Sig  . Ascorbic Acid (VITAMIN C PO) Take 500 mg by mouth daily.  Dorise Hiss ([name]) 140 MG/ML SOAJ Inject 140 mg into the skin every 30 (thirty) days.  Marylin Crosby Succinate (REYVOW) 100 MG TABS Take 1 tablet by mouth as needed (Maximum 1 tablet in 24 hours).  Marland Kitchen tiZANidine (ZANAFLEX) 4 MG tablet TAKE 1/2 TABLET BY MOUTH EVERY NIGHT AT BEDTIME  . venlafaxine XR (EFFEXOR XR) 37.5 MG 24 hr capsule Take 1 capsule (37.5 mg total) by mouth daily with breakfast.  . [DISCONTINUED] diclofenac (VOLTAREN) 75 MG EC tablet Take 1 tablet (75 mg total) by mouth 2 (two) times daily as needed.  . [DISCONTINUED] Multiple Vitamin (MULTIVITAMIN) tablet Take 1 tablet by mouth daily.  . [DISCONTINUED] topiramate (TOPAMAX) 25 MG tablet TAKE ONE TABLET BY MOUTH AT BEDTIME FOR ONE WEEK, THEN  INCREASE TO TWO TABLETS AT BEDTIME    Review of Systems  Constitutional: Negative for chills, fatigue and fever.  Respiratory: Negative for cough, chest tightness, shortness of breath and wheezing.   Cardiovascular: Negative for chest pain, palpitations and leg swelling.    Objective:  BP 128/90 (BP Location: Left Arm, Patient Position: Sitting, Cuff Size: Large)   Pulse 63   Temp 98 F (36.7 C) (Oral)   Ht 5\' 4"  (1.626 m)   Wt 180 lb 9.6 oz (81.9 kg)   BMI 31.00 kg/m   Weight: 180 lb 9.6 oz (81.9 kg)   BP Readings from Last 3 Encounters:  03/07/20 128/90  09/12/19 130/82  10/07/18 132/72   Wt Readings from Last 3 Encounters:  03/07/20 180 lb 9.6 oz (81.9 kg)  09/12/19 189 lb (85.7 kg)  04/11/19 180 lb (81.6 kg)    Physical Exam Constitutional:      General: She is not in acute distress.    Appearance: She is well-developed.  Cardiovascular:     Rate and Rhythm: Normal rate and regular rhythm.     Heart sounds: Normal heart sounds. No murmur heard. No friction rub.  Pulmonary:     Effort: Pulmonary effort is normal. No respiratory distress.     Breath sounds: Normal breath sounds. No wheezing or rales.  Musculoskeletal:     Right lower leg: No edema.     Left lower leg: No edema.  Neurological:  Mental Status: She is alert and oriented to person, place, and time.  Psychiatric:        Behavior: Behavior normal.     Assessment/Plan  1. Hot flashes Hot flashes are very frequent and disruptive during day and night.  She is willing to try medication to help with this.  We discussed first starting with nonhormonal treatments.  I have selected Effexor after discussion with patient about side effects of this medication.  I am hopeful this may also help with migraine prevention.Discussed new medication(s) today with patient. Discussed potential side effects and patient verbalized understanding. We will start with 37.5 nightly and increase as needed.  She is going to  update me in about 2 to 3 weeks through [place].  We discussed alternatives for treatment including black cohosh.  We discussed weight loss can be beneficial.  (Of note, she feels that previous weight was put in incorrectly and that her weight has been stable.  She does not think that she has recently lost weight).  2. Migraine without status migrainosus, not intractable, unspecified migraine type Migraines been stable.  Hopefully she will get some benefit to the Effexor.  3. Insomnia, unspecified type I am hoping with Effexor that sleep may improve.  Sleep is always been an issue for her.  Hopefully with less hot flashes and perhaps with some lightening of burden of worry/anxiety, she will sleep better.  We discussed importance of regular exercise which will help with mood, migraines, sleep.   Return in about 6 months (around 09/04/2020) for physical exam.      Micheline Rough, MD

## 2020-03-24 ENCOUNTER — Other Ambulatory Visit: Payer: Self-pay | Admitting: Neurology

## 2020-04-04 DIAGNOSIS — Z03818 Encounter for observation for suspected exposure to other biological agents ruled out: Secondary | ICD-10-CM | POA: Diagnosis not present

## 2020-04-04 DIAGNOSIS — Z20822 Contact with and (suspected) exposure to covid-19: Secondary | ICD-10-CM | POA: Diagnosis not present

## 2020-05-04 DIAGNOSIS — R040 Epistaxis: Secondary | ICD-10-CM | POA: Diagnosis not present

## 2020-05-25 DIAGNOSIS — L720 Epidermal cyst: Secondary | ICD-10-CM | POA: Diagnosis not present

## 2020-05-25 DIAGNOSIS — L0889 Other specified local infections of the skin and subcutaneous tissue: Secondary | ICD-10-CM | POA: Diagnosis not present

## 2020-05-25 DIAGNOSIS — L738 Other specified follicular disorders: Secondary | ICD-10-CM | POA: Diagnosis not present

## 2020-05-25 DIAGNOSIS — R040 Epistaxis: Secondary | ICD-10-CM | POA: Diagnosis not present

## 2020-05-30 ENCOUNTER — Encounter: Payer: Self-pay | Admitting: Family Medicine

## 2020-06-04 NOTE — Progress Notes (Signed)
NEUROLOGY FOLLOW UP OFFICE NOTE  [facility] [id number]  Assessment/Plan:   Migraine without aura, without status migrainosus, not intractable  1.  Migraine prevention:  [name] 140mg  Q4w 2.  Migraine rescue:  Flurbiprofen 100mg  3.  Limit use of pain relievers to no more than 2 days out of week to prevent risk of rebound or medication-overuse headache. 4.  Keep headache diary 5.  Follow up in 9 months.  Subjective:  Karen Davis is a 50 year old right-handed woman whofollows up for migraines.  UPDATE: She got new insurance who would cover [name].  Stopped using Reyvow because did not like how it made her feel.  Using flurbiprofen again, which is working great. Intensity: Moderate to severe Duration:20 minutes Frequency:She has had 5 migraines over past 30 days. Frequency of abortive medication: Current NSAIDS:flurbiprofen Current analgesics:Tylenol Sinus Current triptans:none Current ergotamine:None Current anti-emetic:None Current muscle relaxants:tizandine Current anti-anxiolytic:None Current sleep aide:None Current Antihypertensive medications:None Current Antidepressant medications:venlafaxine XR 37.5mg  QD Current Anticonvulsant medications:None Current anti-CGRP:[name] 140mg   Current Vitamins/Herbal/Supplements:Centrum, iron Current Antihistamines/Decongestants:None Other therapy:none Hormone/birth control: None  Caffeine:1 cup coffee and maybe 1 soda daily Diet:hydrates Exercise:Not routine Depression:no; Anxiety:no Other pain:Neck pain Sleep hygiene:ok  HISTORY: Onset: 2013 or 2014 Location: Right maxillary/periorbital region, sometimes radiating to occipital region Quality: Pressure, progressing to stabbing Initial Intensity: 10/10; May: 8/10 Aura: no Prodrome: no Associated symptoms: Nausea, photophobia Initial Duration: 30 minutes with sumatriptan, otherwise several hours;  May: Usually 1 hour Initial Frequency: 6 days per month Triggers: Change in barometric pressure, heat Relieving factors: None Activity: Needs to lay down  Past NSAIDS: flurbiprofen, oxaprozin, ibuprofen, ketoprofen, etodolac, Mobic Past analgesics: Excedrin, Tylenol Past abortive triptans: sumatriptan 100mg , rizatriptan 10mg  Past muscle relaxants: no Past anti-emetic: no Past anti-anxiolytic: no Past sleep aide: no  Past antihypertensive medications: verapamil HCL ER 120mg  (was effective but she stopped due to feeling depressed) Past antidepressant medications: Nortriptyline 25mg  Past anticonvulsant medications: Topiramate (effective but caused diarrhea) Past anti-CGRP: [name] 140mg  (effective but no longer covered by insurance); [name] (ineffective).   Other past therapy:  Reyvow Past vitamins/Herbal/Supplements: riboflavin Past antihistamines/decongestants: no  Family history of headache: Mother had migraines. Brother had stroke in his 91s. Paternal great grandfather had stroke in his 46s.  MRI and MRA of head and MRA of neck from 11/12/13 were personally reviewed and were unremarkable.  She also reports bilateral hand numbness and tingling that can radiate up to above elbows. They are prominent when in bed and often wakes her up. She denies neck pain or weakness. NCV-EMG was normal. No evidence of carpal tunnel. She does wear wrist splints at night when it flares up and it helps.  She also has chronic low back pain since slipping and falling on her coccyx around 2015. She feels right lower back pain radiating down into her buttocks and associated with numbness and tingling of her anterior thigh. There is no weakness. She had PT for low back pain. Traction helped.  PAST MEDICAL HISTORY: Past Medical History:  Diagnosis Date  . Alcohol abuse   . Anemia   . Depression   . Migraines     MEDICATIONS: Current Outpatient Medications on File Prior  to Visit  Medication Sig Dispense Refill  . Ascorbic Acid (VITAMIN C PO) Take 500 mg by mouth daily.    Eduard Roux ([name]) 140 MG/ML SOAJ Inject 140 mg into the skin every 30 (thirty) days. 1.12 mL 5  . Lasmiditan Succinate (REYVOW) 100 MG TABS Take 1 tablet by mouth as  needed (Maximum 1 tablet in 24 hours). 30 tablet 3  . tiZANidine (ZANAFLEX) 4 MG tablet TAKE ONE-HALF (1/2) TABLET EVERY NIGHT AT BEDTIME 45 tablet 3  . venlafaxine XR (EFFEXOR XR) 37.5 MG 24 hr capsule Take 1 capsule (37.5 mg total) by mouth daily with breakfast. 90 capsule 0   No current facility-administered medications on file prior to visit.    ALLERGIES: Allergies  Allergen Reactions  . Vantin [Cefpodoxime] Other (See Comments)    migraine    FAMILY HISTORY: Family History  Problem Relation Age of Onset  . Melanoma Mother   . Liver disease Father   . Depression Father   . High blood pressure Father   . High Cholesterol Father   . Alcohol abuse Father   . Stroke Brother 13  . Cancer Brother        skin  . Hearing loss Paternal Grandfather   . Skin cancer Maternal Grandmother   . Leukemia Maternal Grandfather   . Arthritis Paternal Grandmother   . Hearing loss Paternal Grandmother       Objective:  Blood pressure (!) 149/85, pulse 84, height 5\' 4"  (1.626 m), weight 192 lb 3.2 oz (87.2 kg), SpO2 95 %. General: No acute distress.  Patient appears well-groomed.   Head:  Normocephalic/atraumatic Eyes:  Fundi examined but not visualized Neck: supple, no paraspinal tenderness, full range of motion Heart:  Regular rate and rhythm Lungs:  Clear to auscultation bilaterally Back: No paraspinal tenderness Neurological Exam: alert and oriented to person, place, and time. Attention span and concentration intact, recent and remote memory intact, fund of knowledge intact.  Speech fluent and not dysarthric, language intact.  CN II-XII intact. Bulk and tone normal, muscle strength 5/5 throughout.  Sensation to  light touch, temperature and vibration intact.  Deep tendon reflexes 2+ throughout, toes downgoing.  Finger to nose and heel to shin testing intact.  Gait normal, Romberg negative.     Metta Clines, DO  CC: Micheline Rough, MD

## 2020-06-06 ENCOUNTER — Other Ambulatory Visit: Payer: Self-pay

## 2020-06-06 ENCOUNTER — Ambulatory Visit (INDEPENDENT_AMBULATORY_CARE_PROVIDER_SITE_OTHER): Payer: BC Managed Care – PPO | Admitting: Neurology

## 2020-06-06 ENCOUNTER — Encounter: Payer: Self-pay | Admitting: Neurology

## 2020-06-06 DIAGNOSIS — G43009 Migraine without aura, not intractable, without status migrainosus: Secondary | ICD-10-CM | POA: Diagnosis not present

## 2020-06-06 MED ORDER — FLURBIPROFEN 100 MG PO TABS: 100.0000 mg | ORAL_TABLET | Freq: Three times a day (TID) | ORAL | 5 refills | Status: DC | PRN

## 2020-06-06 MED ORDER — AIMOVIG 140 MG/ML ~~LOC~~ SOAJ: 140.0000 mg | SUBCUTANEOUS | 5 refills | Status: DC

## 2020-06-06 NOTE — Patient Instructions (Addendum)
1. [name] 140mg  every 4 weeks 2. When you get a migraine, take flurbiprofen 100mg  every 8 hours (maximum 3 tablets in 24 hours) 3.  Follow up 9 months

## 2020-06-07 MED ORDER — VENLAFAXINE HCL ER 75 MG PO CP24
75.0000 mg | ORAL_CAPSULE | Freq: Every day | ORAL | 1 refills | Status: DC
Start: 2020-06-07 — End: 2020-09-06

## 2020-06-19 ENCOUNTER — Other Ambulatory Visit: Payer: Self-pay

## 2020-06-19 ENCOUNTER — Telehealth: Payer: Self-pay | Admitting: Neurology

## 2020-06-19 NOTE — Telephone Encounter (Signed)
Patient called in stating [facility] had not heard from [place] about refilling her tazanidine. She needs a refill sent in.

## 2020-06-19 NOTE — Telephone Encounter (Signed)
Sent to Dr. Tomi Likens

## 2020-06-20 MED ORDER — TIZANIDINE HCL 4 MG PO TABS: ORAL_TABLET | ORAL | 3 refills | Status: DC

## 2020-07-20 DIAGNOSIS — D225 Melanocytic nevi of trunk: Secondary | ICD-10-CM | POA: Diagnosis not present

## 2020-07-20 DIAGNOSIS — D2262 Melanocytic nevi of left upper limb, including shoulder: Secondary | ICD-10-CM | POA: Diagnosis not present

## 2020-07-20 DIAGNOSIS — D2271 Melanocytic nevi of right lower limb, including hip: Secondary | ICD-10-CM | POA: Diagnosis not present

## 2020-07-20 DIAGNOSIS — D2261 Melanocytic nevi of right upper limb, including shoulder: Secondary | ICD-10-CM | POA: Diagnosis not present

## 2020-09-05 ENCOUNTER — Other Ambulatory Visit: Payer: Self-pay

## 2020-09-05 ENCOUNTER — Encounter: Payer: Self-pay | Admitting: Family Medicine

## 2020-09-05 ENCOUNTER — Ambulatory Visit (INDEPENDENT_AMBULATORY_CARE_PROVIDER_SITE_OTHER): Payer: BC Managed Care – PPO | Admitting: Family Medicine

## 2020-09-05 ENCOUNTER — Other Ambulatory Visit: Payer: Self-pay | Admitting: Family Medicine

## 2020-09-05 ENCOUNTER — Other Ambulatory Visit (HOSPITAL_COMMUNITY)
Admission: RE | Admit: 2020-09-05 | Discharge: 2020-09-05 | Disposition: A | Discharge status: Home / Self Care | Payer: BC Managed Care – PPO | Source: Ambulatory Visit | Attending: Family Medicine | Admitting: Family Medicine

## 2020-09-05 VITALS — BP 120/80 | HR 79 | Temp 98.1°F | Ht 63.5 in | Wt 204.9 lb

## 2020-09-05 DIAGNOSIS — Z13220 Encounter for screening for lipoid disorders: Secondary | ICD-10-CM

## 2020-09-05 DIAGNOSIS — Z0000 Encounter for general adult medical examination without abnormal findings: Secondary | ICD-10-CM

## 2020-09-05 DIAGNOSIS — R7301 Impaired fasting glucose: Secondary | ICD-10-CM

## 2020-09-05 DIAGNOSIS — R635 Abnormal weight gain: Secondary | ICD-10-CM | POA: Diagnosis not present

## 2020-09-05 DIAGNOSIS — Z124 Encounter for screening for malignant neoplasm of cervix: Secondary | ICD-10-CM | POA: Insufficient documentation

## 2020-09-05 DIAGNOSIS — Z1231 Encounter for screening mammogram for malignant neoplasm of breast: Secondary | ICD-10-CM

## 2020-09-05 DIAGNOSIS — D649 Anemia, unspecified: Secondary | ICD-10-CM | POA: Diagnosis not present

## 2020-09-05 DIAGNOSIS — R002 Palpitations: Secondary | ICD-10-CM | POA: Diagnosis not present

## 2020-09-05 DIAGNOSIS — Z1211 Encounter for screening for malignant neoplasm of colon: Secondary | ICD-10-CM | POA: Diagnosis not present

## 2020-09-05 LAB — CBC WITH DIFFERENTIAL/PLATELET
Basophils Absolute: 0 10*3/uL (ref 0.0–0.1)
Basophils Relative: 0.6 % (ref 0.0–3.0)
Eosinophils Absolute: 0.1 10*3/uL (ref 0.0–0.7)
Eosinophils Relative: 1.3 % (ref 0.0–5.0)
HCT: 37.7 % (ref 36.0–46.0)
Hemoglobin: 12.7 g/dL (ref 12.0–15.0)
Lymphocytes Relative: 40.1 % (ref 12.0–46.0)
Lymphs Abs: 2.4 10*3/uL (ref 0.7–4.0)
MCHC: 33.8 g/dL (ref 30.0–36.0)
MCV: 81.8 fl (ref 78.0–100.0)
Monocytes Absolute: 0.3 10*3/uL (ref 0.1–1.0)
Monocytes Relative: 5.4 % (ref 3.0–12.0)
Neutro Abs: 3.2 10*3/uL (ref 1.4–7.7)
Neutrophils Relative %: 52.6 % (ref 43.0–77.0)
Platelets: 328 10*3/uL (ref 150.0–400.0)
RBC: 4.61 Mil/uL (ref 3.87–5.11)
RDW: 16 % — ABNORMAL HIGH (ref 11.5–15.5)
WBC: 6.1 10*3/uL (ref 4.0–10.5)

## 2020-09-05 LAB — LIPID PANEL
Cholesterol: 203 mg/dL — ABNORMAL HIGH (ref 0–200)
HDL: 65 mg/dL (ref >39.00)
LDL Cholesterol: 121 mg/dL — ABNORMAL HIGH (ref 0–99)
NonHDL: 137.87
Total CHOL/HDL Ratio: 3
Triglycerides: 85 mg/dL (ref 0.0–149.0)
VLDL: 17 mg/dL (ref 0.0–40.0)

## 2020-09-05 LAB — COMPREHENSIVE METABOLIC PANEL
ALT: 32 U/L (ref 0–35)
AST: 20 U/L (ref 0–37)
Albumin: 4.5 g/dL (ref 3.5–5.2)
Alkaline Phosphatase: 101 U/L (ref 39–117)
BUN: 15 mg/dL (ref 6–23)
CO2: 29 mEq/L (ref 19–32)
Calcium: 9.2 mg/dL (ref 8.4–10.5)
Chloride: 101 mEq/L (ref 96–112)
Creatinine, Ser: 0.75 mg/dL (ref 0.40–1.20)
GFR: 93.15 mL/min (ref >60.00)
Glucose, Bld: 105 mg/dL — ABNORMAL HIGH (ref 70–99)
Potassium: 4.1 mEq/L (ref 3.5–5.1)
Sodium: 139 mEq/L (ref 135–145)
Total Bilirubin: 0.3 mg/dL (ref 0.2–1.2)
Total Protein: 7.2 g/dL (ref 6.0–8.3)

## 2020-09-05 LAB — T4, FREE: Free T4: 0.61 ng/dL (ref 0.60–1.60)

## 2020-09-05 LAB — IBC PANEL
Iron: 47 ug/dL (ref 42–145)
Saturation Ratios: 12.1 % — ABNORMAL LOW (ref 20.0–50.0)
Transferrin: 277 mg/dL (ref 212.0–360.0)

## 2020-09-05 LAB — HEMOGLOBIN A1C: Hgb A1c MFr Bld: 6 % (ref 4.6–6.5)

## 2020-09-05 LAB — FERRITIN: Ferritin: 28.7 ng/mL (ref 10.0–291.0)

## 2020-09-05 LAB — TSH: TSH: 1.76 u[IU]/mL (ref 0.35–5.50)

## 2020-09-05 NOTE — Progress Notes (Signed)
Karen Davis DOB: December 01, 1970 Encounter date: 09/05/2020  This is a 50 y.o. female who presents for complete physical   History of present illness/Additional concerns: Last visit with me was 03/07/20. We started effexor at that time to manage hot flashes. She had significant reduction with 30mg ; so we increased end of march to 75mg  to see if we could help a little more with resolution. Hot flashes are doing really well - notes that they are heat related - like being around oven.   Migraine:does follow with neurology; back on [name]. It has worked well for her in past, just had to stop due to insurance issues.  mammograM: last completed 10/2019 Colonoscopy: last was over 10 years ago. Thinks maybe with [facility]. Initially had colonoscopy for change in bowel habits.   She is eating more carbs - she is walking regularly. Got apply watch so that has helped with activity. Hasn't done specific eating plans at this time. Daughter is moving out soon which she thinks will help.   Does follow with dermatology regularly. Dr. Ubaldo Glassing.   Past Medical History:  Diagnosis Date   Alcohol abuse    Anemia    Depression    Migraines    Pre-diabetes    Past Surgical History:  Procedure Laterality Date   FINGER SURGERY Right 2002   pinkie    KNEE SURGERY Right    ; 1987, 1995   left shoulder surgery  2020   PARTIAL HYSTERECTOMY  2019   left ovary remains; hysterectomy for ovarian and menorrhagia   PILONIDAL CYST EXCISION  1989   TEAR DUCT PROBING Left 1972   Allergies  Allergen Reactions   Vantin [Cefpodoxime] Other (See Comments)    migraine   Current Meds  Medication Sig   Ascorbic Acid (VITAMIN C PO) Take 500 mg by mouth daily.   Cholecalciferol (VITAMIN D3 PO) Take by mouth daily.   Erenumab-aooe ([name]) 140 MG/ML SOAJ Inject 140 mg into the skin every 30 (thirty) days.   Ferrous Sulfate (IRON PO) Take 65 mg by mouth once a week.   flurbiprofen (ANSAID) 100 MG tablet Take 1 tablet (100 mg  total) by mouth every 8 (eight) hours as needed (Maximum 3 tablets in 24 hours).   Riboflavin (VITAMIN B-2 PO) Take by mouth daily.   TART CHERRY PO Take by mouth daily.   venlafaxine XR (EFFEXOR XR) 75 MG 24 hr capsule Take 1 capsule (75 mg total) by mouth daily with breakfast.   Social History   Tobacco Use   Smoking status: Former    Packs/day: 1.00    Years: 20.00    Pack years: 20.00    Types: Cigarettes    Quit date: 10/08/2003    Years since quitting: 16.9   Smokeless tobacco: Never  Substance Use Topics   Alcohol use: No   Family History  Problem Relation Age of Onset   Melanoma Mother    Liver disease Father    Depression Father    High blood pressure Father    High Cholesterol Father    Alcohol abuse Father    Stroke Brother 42   Cancer Brother        skin   Hearing loss Paternal Grandfather    Skin cancer Maternal Grandmother    Leukemia Maternal Grandfather    Arthritis Paternal Grandmother    Hearing loss Paternal Grandmother      Review of Systems  Constitutional:  Positive for unexpected weight change (doesn't feel like  much has changed, but continues to gain weight). Negative for activity change, appetite change, chills, fatigue and fever.  HENT:  Negative for congestion, ear pain, hearing loss, sinus pressure, sinus pain, sore throat and trouble swallowing.   Eyes:  Negative for pain and visual disturbance.  Respiratory:  Negative for cough, chest tightness, shortness of breath and wheezing.   Cardiovascular:  Positive for palpitations (one episode last week in evening didn't feel great and HR 120's. went to bed early. when woke at 1am it was better. no sob, no dizziness, no hx of palpitations.). Negative for chest pain and leg swelling.  Gastrointestinal:  Negative for abdominal pain, blood in stool, constipation, diarrhea, nausea and vomiting.  Genitourinary:  Negative for difficulty urinating and menstrual problem.  Musculoskeletal:  Negative for  arthralgias and back pain.  Skin:  Negative for rash.  Neurological:  Negative for dizziness, weakness, numbness and headaches.  Hematological:  Negative for adenopathy. Does not bruise/bleed easily.  Psychiatric/Behavioral:  Negative for sleep disturbance and suicidal ideas. The patient is not nervous/anxious.    CBC:  Lab Results  Component Value Date   WBC 6.3 09/16/2019   HGB 13.6 09/16/2019   HCT 43.8 09/16/2019   MCH 26.9 (L) 09/16/2019   MCHC 31.1 (L) 09/16/2019   RDW 14.6 09/16/2019   PLT 361 09/16/2019   MPV 10.0 09/16/2019   CMP: Lab Results  Component Value Date   NA 141 09/16/2019   K 4.4 09/16/2019   CL 108 09/16/2019   CO2 27 09/16/2019   GLUCOSE 99 09/16/2019   BUN 16 09/16/2019   CREATININE 0.63 09/16/2019   CALCIUM 9.4 09/16/2019   PROT 6.9 09/16/2019   BILITOT 0.4 09/16/2019   ALT 25 09/16/2019   AST 19 09/16/2019   LIPID: Lab Results  Component Value Date   CHOL 201 (H) 09/16/2019   TRIG 75 09/16/2019   HDL 73 09/16/2019   LDLCALC 112 (H) 09/16/2019    Objective:  BP 120/80 (BP Location: Left Arm, Patient Position: Sitting, Cuff Size: Large)   Pulse 79   Temp 98.1 F (36.7 C) (Oral)   Ht 5' 3.5" (1.613 m)   Wt 204 lb 14.4 oz (92.9 kg)   SpO2 98%   BMI 35.73 kg/m   Weight: 204 lb 14.4 oz (92.9 kg)   BP Readings from Last 3 Encounters:  09/05/20 120/80  06/06/20 (!) 149/85  03/07/20 128/82   Wt Readings from Last 3 Encounters:  09/05/20 204 lb 14.4 oz (92.9 kg)  06/06/20 192 lb 3.2 oz (87.2 kg)  03/07/20 180 lb 9.6 oz (81.9 kg)    Physical Exam Constitutional:      General: She is not in acute distress.    Appearance: She is well-developed.  HENT:     Head: Normocephalic and atraumatic.     Right Ear: External ear normal.     Left Ear: External ear normal.     Mouth/Throat:     Pharynx: No oropharyngeal exudate.  Eyes:     Conjunctiva/sclera: Conjunctivae normal.     Pupils: Pupils are equal, round, and reactive to light.   Neck:     Thyroid: No thyromegaly.  Cardiovascular:     Rate and Rhythm: Normal rate and regular rhythm.     Heart sounds: Normal heart sounds. No murmur heard.   No friction rub. No gallop.  Pulmonary:     Effort: Pulmonary effort is normal.     Breath sounds: Normal breath sounds.  Abdominal:  General: Bowel sounds are normal. There is no distension.     Palpations: Abdomen is soft. There is no mass.     Tenderness: There is no abdominal tenderness. There is no guarding.     Hernia: No hernia is present.  Musculoskeletal:        General: No tenderness or deformity. Normal range of motion.     Cervical back: Normal range of motion and neck supple.  Lymphadenopathy:     Cervical: No cervical adenopathy.  Skin:    General: Skin is warm and dry.     Findings: No rash.  Neurological:     Mental Status: She is alert and oriented to person, place, and time.     Deep Tendon Reflexes: Reflexes normal.     Reflex Scores:      Tricep reflexes are 2+ on the right side and 2+ on the left side.      Bicep reflexes are 2+ on the right side and 2+ on the left side.      Brachioradialis reflexes are 2+ on the right side and 2+ on the left side.      Patellar reflexes are 2+ on the right side and 2+ on the left side. Psychiatric:        Speech: Speech normal.        Behavior: Behavior normal.        Thought Content: Thought content normal.    Assessment/Plan: Health Maintenance Due  Topic Date Due   COLONOSCOPY (Pts 45-31yrs Insurance coverage will need to be confirmed)  09/29/2015   PAP SMEAR-Modifier  03/03/2020   Health Maintenance reviewed - she will schedule mammogram.   1. Preventative health care We discussed increasing step counts daily, lower carb eating will be important with impaired glucose. Would consider dietician if we feel beneficial after labwork.   2. Screening for colon cancer - Ambulatory referral to Gastroenterology  3. Weight gain Hasn't tried structured  diet plan; may be influenced by effexor, but weight gain started prior to starting that medication. Start with bloodwork and then may ned to have her return to discuss weight loss options.   4. Palpitation Ekg stable, sinus rhythm, no acute changes. Discussed importance of hydration. Will get bloodwork today as well. Palpitation was isolated episode. - EKG 12-Lead - CBC with Differential/Platelet; Future - TSH; Future - T4, free; Future  5. Impaired fasting glucose - Comprehensive metabolic panel; Future - Hemoglobin A1c; Future  6. Lipid screening - Lipid panel; Future  Return for pending bloodwork.  Micheline Rough, MD

## 2020-09-06 ENCOUNTER — Other Ambulatory Visit: Payer: Self-pay | Admitting: *Deleted

## 2020-09-06 MED ORDER — VENLAFAXINE HCL ER 75 MG PO CP24: 75.0000 mg | ORAL_CAPSULE | Freq: Every day | ORAL | 1 refills | Status: DC

## 2020-09-06 NOTE — Telephone Encounter (Signed)
Rx done. 

## 2020-09-12 LAB — CYTOLOGY - PAP
Comment: NEGATIVE
Diagnosis: NEGATIVE
High risk HPV: POSITIVE — AB

## 2020-09-13 ENCOUNTER — Telehealth: Payer: Self-pay | Admitting: Family Medicine

## 2020-09-13 NOTE — Telephone Encounter (Signed)
Patient is returning [place] call.

## 2020-09-13 NOTE — Telephone Encounter (Signed)
See results note. 

## 2020-09-14 ENCOUNTER — Encounter: Payer: Self-pay | Admitting: Gastroenterology

## 2020-09-20 DIAGNOSIS — R87811 Vaginal high risk human papillomavirus (HPV) DNA test positive: Secondary | ICD-10-CM | POA: Diagnosis not present

## 2020-10-10 ENCOUNTER — Ambulatory Visit (AMBULATORY_SURGERY_CENTER): Payer: BC Managed Care – PPO

## 2020-10-10 ENCOUNTER — Other Ambulatory Visit: Payer: Self-pay

## 2020-10-10 VITALS — Ht 63.5 in | Wt 200.0 lb

## 2020-10-10 DIAGNOSIS — Z1211 Encounter for screening for malignant neoplasm of colon: Secondary | ICD-10-CM

## 2020-10-10 MED ORDER — CLENPIQ 10-3.5-12 MG-GM -GM/160ML PO SOLN: 1.0000 | Freq: Once | ORAL | 0 refills | Status: AC

## 2020-10-10 NOTE — Progress Notes (Signed)
Patient's pre-visit was done today over the phone with the patient Name,DOB and address verified. Patient denies any allergies to Eggs and Soy. Patient denies any problems with anesthesia/sedation. Patient denies taking diet pills or blood thinners. No home Oxygen. Packet of Prep instructions mailed to patient including a copy of a consent form-pt is aware. Patient understands to call us back with any questions or concerns. Patient is aware of our care-partner policy and 0000000 safety protocol.    The patient is COVID-19 vaccinated.

## 2020-10-15 DIAGNOSIS — N72 Inflammatory disease of cervix uteri: Secondary | ICD-10-CM | POA: Diagnosis not present

## 2020-10-15 DIAGNOSIS — R87810 Cervical high risk human papillomavirus (HPV) DNA test positive: Secondary | ICD-10-CM | POA: Diagnosis not present

## 2020-10-22 ENCOUNTER — Encounter: Payer: Self-pay | Admitting: Gastroenterology

## 2020-10-24 ENCOUNTER — Encounter: Payer: Self-pay | Admitting: Gastroenterology

## 2020-10-24 ENCOUNTER — Ambulatory Visit (AMBULATORY_SURGERY_CENTER): Payer: BC Managed Care – PPO | Admitting: Gastroenterology

## 2020-10-24 ENCOUNTER — Other Ambulatory Visit: Payer: Self-pay

## 2020-10-24 VITALS — BP 167/103 | HR 80 | Temp 97.8°F | Resp 16 | Ht 63.0 in | Wt 200.0 lb

## 2020-10-24 DIAGNOSIS — D123 Benign neoplasm of transverse colon: Secondary | ICD-10-CM

## 2020-10-24 DIAGNOSIS — Z1211 Encounter for screening for malignant neoplasm of colon: Secondary | ICD-10-CM | POA: Diagnosis not present

## 2020-10-24 MED ORDER — SODIUM CHLORIDE 0.9 % IV SOLN: 500.0000 mL | INTRAVENOUS | Status: DC

## 2020-10-24 NOTE — Progress Notes (Signed)
Called to room to assist during endoscopic procedure.  Patient ID and intended procedure confirmed with present staff. Received instructions for my participation in the procedure from the performing physician.  

## 2020-10-24 NOTE — Progress Notes (Signed)
Pt Drowsy. VSS. To PACU, report to RN. No anesthetic complications noted.  

## 2020-10-24 NOTE — Op Note (Signed)
[facility] Patient Name: Karen Davis Procedure Date: 10/24/2020 1:45 PM MRN: [id number] Endoscopist: Nicki Reaper E. Candis Schatz , MD Age: 50 Referring MD:  Date of Birth: 06-Apr-1970 Gender: Female Account #: [id number] Procedure:                Colonoscopy Indications:              Screening for colorectal malignant neoplasm Medicines:                Monitored Anesthesia Care Procedure:                Pre-Anesthesia Assessment:                           - Prior to the procedure, a History and Physical                            was performed, and patient medications and                            allergies were reviewed. The patient's tolerance of                            previous anesthesia was also reviewed. The risks                            and benefits of the procedure and the sedation                            options and risks were discussed with the patient.                            All questions were answered, and informed consent                            was obtained. Prior Anticoagulants: The patient has                            taken no previous anticoagulant or antiplatelet                            agents. ASA Grade Assessment: II - A patient with                            mild systemic disease. After reviewing the risks                            and benefits, the patient was deemed in                            satisfactory condition to undergo the procedure.                           After obtaining informed consent, the colonoscope  was passed under direct vision. Throughout the                            procedure, the patient's blood pressure, pulse, and                            oxygen saturations were monitored continuously. The                            Olympus CF-HQ190L (Serial# [id number]) Colonoscope was                            introduced through the anus and advanced to the the                            terminal  ileum, with identification of the                            appendiceal orifice and IC valve. The colonoscopy                            was somewhat difficult due to restricted mobility                            of the colon and a tortuous colon. The patient                            tolerated the procedure well. The quality of the                            bowel preparation was adequate. Scope In: 2:07:55 PM Scope Out: 2:28:16 PM Scope Withdrawal Time: 0 hours 15 minutes 18 seconds  Total Procedure Duration: 0 hours 20 minutes 21 seconds  Findings:                 The perianal and digital rectal examinations were                            normal. Pertinent negatives include normal                            sphincter tone and no palpable rectal lesions.                           A 1 mm polyp was found in the hepatic flexure. The                            polyp was sessile. The polyp was removed with a                            jumbo cold forceps. Resection and retrieval were                            complete.  Estimated blood loss was minimal.                           The exam was otherwise normal throughout the                            examined colon.                           The terminal ileum appeared normal.                           The retroflexed view of the distal rectum and anal                            verge was normal and showed no anal or rectal                            abnormalities. Complications:            No immediate complications. Estimated Blood Loss:     Estimated blood loss was minimal. Impression:               - One 1 mm polyp at the hepatic flexure, removed                            with a jumbo cold forceps. Resected and retrieved.                           - The examined portion of the ileum was normal.                           - The distal rectum and anal verge are normal on                            retroflexion view. Recommendation:            - Patient has a contact number available for                            emergencies. The signs and symptoms of potential                            delayed complications were discussed with the                            patient. Return to normal activities tomorrow.                            Written discharge instructions were provided to the                            patient.                           - Resume previous diet.                           -  Continue present medications.                           - Await pathology results.                           - Repeat colonoscopy in 10 years for surveillance. Junice Fei E. Candis Schatz, MD 10/24/2020 2:33:19 PM This report has been signed electronically.

## 2020-10-24 NOTE — Patient Instructions (Addendum)
Thank you for coming to see [place] today.  Results of the polyp removed will be available in 1-2 weeks.  Recommend next screening colonoscopy in 10 years.  Resume previous diet and medications today, return to normal daily activities tomorrow.   YOU HAD AN ENDOSCOPIC PROCEDURE TODAY AT [facility]:   Refer to the procedure report that was given to you for any specific questions about what was found during the examination.  If the procedure report does not answer your questions, please call your gastroenterologist to clarify.  If you requested that your care partner not be given the details of your procedure findings, then the procedure report has been included in a sealed envelope for you to review at your convenience later.  YOU SHOULD EXPECT: Some feelings of bloating in the abdomen. Passage of more gas than usual.  Walking can help get rid of the air that was put into your GI tract during the procedure and reduce the bloating. If you had a lower endoscopy (such as a colonoscopy or flexible sigmoidoscopy) you may notice spotting of blood in your stool or on the toilet paper. If you underwent a bowel prep for your procedure, you may not have a normal bowel movement for a few days.  Please Note:  You might notice some irritation and congestion in your nose or some drainage.  This is from the oxygen used during your procedure.  There is no need for concern and it should clear up in a day or so.  SYMPTOMS TO REPORT IMMEDIATELY:  Following lower endoscopy (colonoscopy or flexible sigmoidoscopy):  Excessive amounts of blood in the stool  Significant tenderness or worsening of abdominal pains  Swelling of the abdomen that is new, acute  Fever of 100F or higher   For urgent or emergent issues, a gastroenterologist can be reached at any hour by calling [phone]. Do not use MyChart messaging for urgent concerns.    DIET:  We do recommend a small meal at first, but then you may  proceed to your regular diet.  Drink plenty of fluids but you should avoid alcoholic beverages for 24 hours.  ACTIVITY:  You should plan to take it easy for the rest of today and you should NOT DRIVE or use heavy machinery until tomorrow (because of the sedation medicines used during the test).    FOLLOW UP: Our staff will call the number listed on your records 48-72 hours following your procedure to check on you and address any questions or concerns that you may have regarding the information given to you following your procedure. If we do not reach you, we will leave a message.  We will attempt to reach you two times.  During this call, we will ask if you have developed any symptoms of COVID 19. If you develop any symptoms (ie: fever, flu-like symptoms, shortness of breath, cough etc.) before then, please call [phone].  If you test positive for Covid 19 in the 2 weeks post procedure, please call and report this information to [place].    If any biopsies were taken you will be contacted by phone or by letter within the next 1-3 weeks.  Please call us at [phone] if you have not heard about the biopsies in 3 weeks.    SIGNATURES/CONFIDENTIALITY: You and/or your care partner have signed paperwork which will be entered into your electronic medical record.  These signatures attest to the fact that that the information above on your After  Visit Summary has been reviewed and is understood.  Full responsibility of the confidentiality of this discharge information lies with you and/or your care-partner.

## 2020-10-24 NOTE — Progress Notes (Signed)
Dt v/s I have reviewed the patient's medical history in detail and updated the computerized patient record.

## 2020-10-24 NOTE — Progress Notes (Signed)
HPI : 50 y/o fm undergoing initial average risk screening colonoscopy.  She has no chronic GI symptoms and no family history of colon cancer.   Past Medical History:  Diagnosis Date   Alcohol abuse    Anemia    Depression    Migraines    Pre-diabetes      Past Surgical History:  Procedure Laterality Date   FINGER SURGERY Right 2002   pinkie    KNEE SURGERY Right    ; 1987, 1995   left shoulder surgery  2020   PARTIAL HYSTERECTOMY  2019   left ovary remains; hysterectomy for ovarian and menorrhagia   PILONIDAL CYST EXCISION  1989   TEAR DUCT PROBING Left 1972   Family History  Problem Relation Age of Onset   Melanoma Mother    Liver disease Father    Depression Father    High blood pressure Father    High Cholesterol Father    Alcohol abuse Father    Stroke Brother 47   Cancer Brother        skin   Skin cancer Maternal Grandmother    Leukemia Maternal Grandfather    Arthritis Paternal Grandmother    Hearing loss Paternal Grandmother    Hearing loss Paternal Grandfather    Colon cancer Neg Hx    Colon polyps Neg Hx    Esophageal cancer Neg Hx    Rectal cancer Neg Hx    Stomach cancer Neg Hx    Social History   Tobacco Use   Smoking status: Former    Packs/day: 1.00    Years: 20.00    Pack years: 20.00    Types: Cigarettes    Quit date: 10/08/2003    Years since quitting: 17.0   Smokeless tobacco: Never  Vaping Use   Vaping Use: Never used  Substance Use Topics   Alcohol use: No   Drug use: No   Current Outpatient Medications  Medication Sig Dispense Refill   Ascorbic Acid (VITAMIN C PO) Take 500 mg by mouth daily.     Cholecalciferol (VITAMIN D3 PO) Take by mouth daily.     Erenumab-aooe ([name]) 140 MG/ML SOAJ Inject 140 mg into the skin every 30 (thirty) days. 1.12 mL 5   Ferrous Sulfate (IRON PO) Take 65 mg by mouth once a week.     flurbiprofen (ANSAID) 100 MG tablet Take 1 tablet (100 mg total) by mouth every 8 (eight) hours as needed  (Maximum 3 tablets in 24 hours). 24 tablet 5   Riboflavin (VITAMIN B-2 PO) Take by mouth daily.     TART CHERRY PO Take by mouth daily.     venlafaxine XR (EFFEXOR XR) 75 MG 24 hr capsule Take 1 capsule (75 mg total) by mouth daily with breakfast. 90 capsule 1   mupirocin ointment (BACTROBAN) 2 % mupirocin 2 % topical ointment  APPLY 1/2 INCH STRIP TO BILATERAL NARES BIDE FOR 7 DAYS. (Patient not taking: Reported on 10/24/2020)     Current Facility-Administered Medications  Medication Dose Route Frequency Provider Last Rate Last Admin   0.9 %  sodium chloride infusion  500 mL Intravenous Continuous Daryel November, MD       Allergies  Allergen Reactions   Vantin [Cefpodoxime] Other (See Comments)    migraine     Review of Systems: All systems reviewed and negative except where noted in HPI.    No results found.  Physical Exam: BP (!) 163/98   Pulse 85  Temp 97.8 F (36.6 C)   Resp 19   Ht '5\' 3"'$  (1.6 m)   Wt 200 lb (90.7 kg)   SpO2 100%   BMI 35.43 kg/m  Constitutional: Pleasant,well-developed, Caucasian female in no acute distress. HEENT: Normocephalic and atraumatic. Conjunctivae are normal. No scleral icterus. MP1 Cardiovascular: Normal rate, regular rhythm.  Pulmonary/chest: Effort normal and breath sounds normal. No wheezing, rales or rhonchi. Abdominal: Soft, nondistended, nontender. Bowel sounds active throughout. There are no masses palpable. No hepatomegaly. Neurological: Alert and oriented to person place and time. Skin: Skin is warm and dry. No rashes noted. Psychiatric: Normal mood and affect. Behavior is normal.  CBC    Component Value Date/Time   WBC 6.1 09/05/2020 0908   RBC 4.61 09/05/2020 0908   HGB 12.7 09/05/2020 0908   HCT 37.7 09/05/2020 0908   PLT 328.0 09/05/2020 0908   MCV 81.8 09/05/2020 0908   MCH 26.9 (L) 09/16/2019 0800   MCHC 33.8 09/05/2020 0908   RDW 16.0 (H) 09/05/2020 0908   LYMPHSABS 2.4 09/05/2020 0908   MONOABS 0.3  09/05/2020 0908   EOSABS 0.1 09/05/2020 0908   BASOSABS 0.0 09/05/2020 0908    CMP     Component Value Date/Time   NA 139 09/05/2020 0908   K 4.1 09/05/2020 0908   CL 101 09/05/2020 0908   CO2 29 09/05/2020 0908   GLUCOSE 105 (H) 09/05/2020 0908   BUN 15 09/05/2020 0908   CREATININE 0.75 09/05/2020 0908   CREATININE 0.63 09/16/2019 0800   CALCIUM 9.2 09/05/2020 0908   PROT 7.2 09/05/2020 0908   ALBUMIN 4.5 09/05/2020 0908   AST 20 09/05/2020 0908   ALT 32 09/05/2020 0908   ALKPHOS 101 09/05/2020 0908   BILITOT 0.3 09/05/2020 0908     ASSESSMENT AND PLAN: 50 y/o fm undergoing initial average risk screening colonoscopy.  She has no chronic GI symptoms and no family history of colon cancer.  Lenford Beddow E. Candis Schatz, MD [facility]  Caren Macadam, MD

## 2020-10-26 ENCOUNTER — Telehealth: Payer: Self-pay | Admitting: *Deleted

## 2020-10-26 NOTE — Telephone Encounter (Signed)
  Follow up Call-  Call back number 10/24/2020  Post procedure Call Back phone  # [phone]  Permission to leave phone message Yes  Some recent data might be hidden     Patient questions:  Do you have a fever, pain , or abdominal swelling? No. Pain Score  0 *  Have you tolerated food without any problems? Yes.    Have you been able to return to your normal activities? Yes.    Do you have any questions about your discharge instructions: Diet   No. Medications  No. Follow up visit  No.  Do you have questions or concerns about your Care? No.  Actions: * If pain score is 4 or above: No action needed, pain <4.  Have you developed a fever since your procedure? no  2.   Have you had an respiratory symptoms (SOB or cough) since your procedure? no  3.   Have you tested positive for COVID 19 since your procedure no  4.   Have you had any family members/close contacts diagnosed with the COVID 19 since your procedure?  no   If yes to any of these questions please route to Joylene John, RN and Joella Prince, RN

## 2020-10-29 ENCOUNTER — Other Ambulatory Visit: Payer: Self-pay

## 2020-10-29 ENCOUNTER — Ambulatory Visit
Admission: RE | Admit: 2020-10-29 | Discharge: 2020-10-29 | Disposition: A | Discharge status: Home / Self Care | Payer: BC Managed Care – PPO | Source: Ambulatory Visit | Attending: Family Medicine | Admitting: Family Medicine

## 2020-10-29 DIAGNOSIS — Z1231 Encounter for screening mammogram for malignant neoplasm of breast: Secondary | ICD-10-CM

## 2020-10-31 NOTE — Progress Notes (Signed)
Karen Davis,  The polyp which I removed during your recent procedure was proven to be completely benign but is considered a "pre-cancerous" polyp that MAY have grown into cancer if it had not been removed.  Studies shows that at least 20% of women over age 51 and 30% of men over age 67 have pre-cancerous polyps.  Based on current nationally recognized surveillance guidelines, I recommend that you have a repeat colonoscopy in 7-10 years.   If you develop any new rectal bleeding, abdominal pain or significant bowel habit changes, please contact me before then.

## 2020-12-11 ENCOUNTER — Other Ambulatory Visit: Payer: Self-pay | Admitting: Neurology

## 2020-12-11 DIAGNOSIS — G43009 Migraine without aura, not intractable, without status migrainosus: Secondary | ICD-10-CM

## 2021-01-18 DIAGNOSIS — H40023 Open angle with borderline findings, high risk, bilateral: Secondary | ICD-10-CM | POA: Diagnosis not present

## 2021-01-29 ENCOUNTER — Telehealth: Payer: Self-pay | Admitting: Neurology

## 2021-01-29 NOTE — Telephone Encounter (Signed)
Pt said she needs the form filled out so she can get her aimovig approved. The number to call is [phone]. Michela Pitcher it was sort of like a PA form, its through [employer]

## 2021-01-30 NOTE — Telephone Encounter (Signed)
F/u    Website CoverMyMeds did not recognize the patient.  I called [facility] script per patient request at [phone] and spoke with Leda Gauze advised me to call [phone].   I spoke with Jerene Pitch to initiate prior authorization over the phone for the medication [name] 140 mg.   Approved effective  11.30.2022 to  11.30.2023   [place] [id number]

## 2021-02-04 ENCOUNTER — Telehealth: Payer: Self-pay | Admitting: Neurology

## 2021-02-04 NOTE — Telephone Encounter (Signed)
Pt called in, said the ins company said they need an RX for 3 months not one month. [name].  [facility]

## 2021-02-06 ENCOUNTER — Other Ambulatory Visit: Payer: Self-pay

## 2021-02-06 DIAGNOSIS — G43009 Migraine without aura, not intractable, without status migrainosus: Secondary | ICD-10-CM

## 2021-02-06 MED ORDER — AIMOVIG 140 MG/ML ~~LOC~~ SOAJ: SUBCUTANEOUS | 0 refills | Status: DC

## 2021-02-06 NOTE — Telephone Encounter (Signed)
3 month supply sent in for pt

## 2021-03-05 ENCOUNTER — Other Ambulatory Visit: Payer: Self-pay | Admitting: Family Medicine

## 2021-03-11 NOTE — Progress Notes (Signed)
NEUROLOGY FOLLOW UP OFFICE NOTE  Karen Davis [id number]  Assessment/Plan:   1  Migraine without aura, without status migrainosus, not intractable 2  Memory deficits   1.  To evaluate cognitive deficits, check B12 and TSH.  If unremarkable, would check MRI of brain Migraine prevention:  [name] 140mg  Q4w 2.  Migraine rescue:  Flurbiprofen 100mg  3.  Limit use of pain relievers to no more than 2 days out of week to prevent risk of rebound or medication-overuse headache. 4.  Keep headache diary 5.  Follow up in 1 year   Subjective:  Karen Davis is a 51 year old right-handed woman who follows up for migraines.   UPDATE: Intensity:  Moderate to severe Duration:  20 minutes Frequency:  1 migraine a month, may have a mild non-throbbing headache 2 days a month. Frequency of abortive medication:   In last 3 months, she reports memory problems.  She has previously been diagnosed with ADD and she feels it is worse.  Cannot multitask.  Her husband has to assist her while she cooks.  No preceding illness.  Has not had COVID as far as she knows.  No new stressors.  No new medications.  Current NSAIDS:  flurbiprofen Current analgesics:  Tylenol Sinus Current triptans:  none Current ergotamine:  None Current anti-emetic:  None Current muscle relaxants:  tizandine Current anti-anxiolytic:  None Current sleep aide:  None Current Antihypertensive medications:  None Current Antidepressant medications:  venlafaxine XR 37.5mg  QD Current Anticonvulsant medications:  None Current anti-CGRP:  [name] 140mg   Current Vitamins/Herbal/Supplements:  riboflavin, ferrous sulfate, D3, C, tart cherry Current Antihistamines/Decongestants:  None Other therapy:  none Hormone/birth control:  None   Caffeine:  1 cup coffee and maybe 1 soda daily Diet:  hydrates Exercise:  Not routine Depression:  no; Anxiety:  no Other pain:  Neck pain Sleep hygiene:  ok   HISTORY: Onset:  2013 or  2014 Location:  Right maxillary/periorbital region, sometimes radiating to occipital region Quality:  Pressure, progressing to stabbing Initial Intensity:  10/10; May:  8/10 Aura:  no Prodrome:  no Associated symptoms:  Nausea, photophobia Initial Duration:  30 minutes with sumatriptan, otherwise several hours; May:  Usually 1 hour Initial Frequency:  6 days per month Triggers:  Change in barometric pressure, heat Relieving factors:  None Activity:  Needs to lay down   Past NSAIDS:  flurbiprofen, oxaprozin, ibuprofen, ketoprofen, etodolac, Mobic Past analgesics:  Excedrin, Tylenol Past abortive triptans:  sumatriptan 100mg , rizatriptan 10mg  Past muscle relaxants:  no Past anti-emetic:  no Past anti-anxiolytic:  no Past sleep aide:  no  Past antihypertensive medications:  verapamil HCL ER 120mg  (was effective but she stopped due to feeling depressed) Past antidepressant medications:  Nortriptyline 25mg  Past anticonvulsant medications:  Topiramate (effective but caused diarrhea) Past anti-CGRP:  [name] 140mg  (effective but no longer covered by insurance); [name] (ineffective).   Other past therapy:  Reyvow Past vitamins/Herbal/Supplements:  riboflavin Past antihistamines/decongestants:  no   Family history of headache:  Mother had migraines.  Brother had stroke in his 53s.  Paternal great grandfather had stroke in his 80s.   MRI and MRA of head and MRA of neck from 11/12/13 were personally reviewed and were unremarkable.   She also reports bilateral hand numbness and tingling that can radiate up to above elbows.  They are prominent when in bed and often wakes her up.  She denies neck pain or weakness.  NCV-EMG was normal.  No evidence of carpal tunnel.  She  does wear wrist splints at night when it flares up and it helps.   She also has chronic low back pain since slipping and falling on her coccyx around 2015.  She feels right lower back pain radiating down into her buttocks and  associated with numbness and tingling of her anterior thigh.  There is no weakness.  She had PT for low back pain.  Traction helped.  PAST MEDICAL HISTORY: Past Medical History:  Diagnosis Date   Alcohol abuse    Anemia    Depression    Migraines    Pre-diabetes     MEDICATIONS: Current Outpatient Medications on File Prior to Visit  Medication Sig Dispense Refill   Ascorbic Acid (VITAMIN C PO) Take 500 mg by mouth daily.     Cholecalciferol (VITAMIN D3 PO) Take by mouth daily.     Erenumab-aooe ([name]) 140 MG/ML SOAJ INJECT 140 MG UNDER THE SKIN EVERY 30 DAYS 3 mL 0   Ferrous Sulfate (IRON PO) Take 65 mg by mouth once a week.     flurbiprofen (ANSAID) 100 MG tablet Take 1 tablet (100 mg total) by mouth every 8 (eight) hours as needed (Maximum 3 tablets in 24 hours). 24 tablet 5   mupirocin ointment (BACTROBAN) 2 % mupirocin 2 % topical ointment  APPLY 1/2 INCH STRIP TO BILATERAL NARES BIDE FOR 7 DAYS. (Patient not taking: Reported on 10/24/2020)     Riboflavin (VITAMIN B-2 PO) Take by mouth daily.     TART CHERRY PO Take by mouth daily.     venlafaxine XR (EFFEXOR-XR) 75 MG 24 hr capsule TAKE 1 CAPSULE DAILY WITH BREAKFAST 90 capsule 3   No current facility-administered medications on file prior to visit.    ALLERGIES: Allergies  Allergen Reactions   Vantin [Cefpodoxime] Other (See Comments)    migraine    FAMILY HISTORY: Family History  Problem Relation Age of Onset   Melanoma Mother    Liver disease Father    Depression Father    High blood pressure Father    High Cholesterol Father    Alcohol abuse Father    Stroke Brother 22   Cancer Brother        skin   Skin cancer Maternal Grandmother    Leukemia Maternal Grandfather    Arthritis Paternal Grandmother    Hearing loss Paternal Grandmother    Hearing loss Paternal Grandfather    Colon cancer Neg Hx    Colon polyps Neg Hx    Esophageal cancer Neg Hx    Rectal cancer Neg Hx    Stomach cancer Neg Hx        Objective:  Blood pressure (!) 151/91, pulse 100, height 5\' 3"  (1.6 m), weight 192 lb 12.8 oz (87.5 kg), SpO2 (!) 73 %. - repeat BP was 120/80. No acute distress.  Patient appears well-groomed.   alert and oriented to person, place, and time.  Speech fluent and not dysarthric, language intact.  CN II-XII intact. Bulk and tone normal, muscle strength 5/5 throughout.  Sensation to light touch intact.  Deep tendon reflexes 2+ throughout.  Finger to nose testing intact.  Gait normal, Romberg negative.   Metta Clines, DO  CC: Micheline Rough, MD

## 2021-03-12 ENCOUNTER — Encounter: Payer: Self-pay | Admitting: Neurology

## 2021-03-12 ENCOUNTER — Ambulatory Visit (INDEPENDENT_AMBULATORY_CARE_PROVIDER_SITE_OTHER): Payer: BC Managed Care – PPO | Admitting: Neurology

## 2021-03-12 ENCOUNTER — Other Ambulatory Visit: Payer: Self-pay

## 2021-03-12 ENCOUNTER — Other Ambulatory Visit (INDEPENDENT_AMBULATORY_CARE_PROVIDER_SITE_OTHER): Payer: BC Managed Care – PPO

## 2021-03-12 VITALS — BP 151/91 | HR 100 | Ht 63.0 in | Wt 192.8 lb

## 2021-03-12 DIAGNOSIS — R413 Other amnesia: Secondary | ICD-10-CM

## 2021-03-12 DIAGNOSIS — G43009 Migraine without aura, not intractable, without status migrainosus: Secondary | ICD-10-CM | POA: Diagnosis not present

## 2021-03-12 LAB — TSH: TSH: 2.03 u[IU]/mL (ref 0.35–5.50)

## 2021-03-12 LAB — VITAMIN B12: Vitamin B-12: 245 pg/mL (ref 211–911)

## 2021-03-12 MED ORDER — FLURBIPROFEN 100 MG PO TABS: 100.0000 mg | ORAL_TABLET | Freq: Three times a day (TID) | ORAL | 1 refills | Status: DC | PRN

## 2021-03-12 MED ORDER — AIMOVIG 140 MG/ML ~~LOC~~ SOAJ: SUBCUTANEOUS | 1 refills | Status: DC

## 2021-03-12 NOTE — Patient Instructions (Addendum)
Refilled [name] and flurbiprofen Check B12 and TSH.  If unremarkable, will check MRI of brain. If blood pressure elevated, follow up with PCP Otherwise, follow up in one year

## 2021-03-13 ENCOUNTER — Telehealth: Payer: Self-pay

## 2021-03-13 DIAGNOSIS — R413 Other amnesia: Secondary | ICD-10-CM

## 2021-03-13 NOTE — Telephone Encounter (Signed)
-----   Message from Pieter Partridge, DO sent at 03/12/2021  3:57 PM EST ----- B12 level is on the low side which may affect memory and concentration.  Recommend starting over the counter B12 1073mcg daily.  Recheck level in 6 months.

## 2021-03-13 NOTE — Telephone Encounter (Signed)
Pt advised of lab results and recommendation to start B12 OTC 1000 mcg.   Lab ordered for 6 months from now. If patient would like she can call and schedule an appt for the lab or come at her spare time to have it drawn in 6 months.

## 2021-04-22 DIAGNOSIS — R87811 Vaginal high risk human papillomavirus (HPV) DNA test positive: Secondary | ICD-10-CM | POA: Diagnosis not present

## 2021-04-22 DIAGNOSIS — R87810 Cervical high risk human papillomavirus (HPV) DNA test positive: Secondary | ICD-10-CM | POA: Diagnosis not present

## 2021-09-09 ENCOUNTER — Other Ambulatory Visit (INDEPENDENT_AMBULATORY_CARE_PROVIDER_SITE_OTHER): Payer: BC Managed Care – PPO

## 2021-09-09 DIAGNOSIS — R413 Other amnesia: Secondary | ICD-10-CM

## 2021-09-10 LAB — VITAMIN B12: Vitamin B-12: 679 pg/mL (ref 211–911)

## 2021-09-30 DIAGNOSIS — Z01419 Encounter for gynecological examination (general) (routine) without abnormal findings: Secondary | ICD-10-CM | POA: Diagnosis not present

## 2021-09-30 DIAGNOSIS — Z6835 Body mass index (BMI) 35.0-35.9, adult: Secondary | ICD-10-CM | POA: Diagnosis not present

## 2021-09-30 DIAGNOSIS — Z1231 Encounter for screening mammogram for malignant neoplasm of breast: Secondary | ICD-10-CM | POA: Diagnosis not present

## 2021-10-17 DIAGNOSIS — D2262 Melanocytic nevi of left upper limb, including shoulder: Secondary | ICD-10-CM | POA: Diagnosis not present

## 2021-10-17 DIAGNOSIS — D2271 Melanocytic nevi of right lower limb, including hip: Secondary | ICD-10-CM | POA: Diagnosis not present

## 2021-10-17 DIAGNOSIS — D225 Melanocytic nevi of trunk: Secondary | ICD-10-CM | POA: Diagnosis not present

## 2021-10-17 DIAGNOSIS — L853 Xerosis cutis: Secondary | ICD-10-CM | POA: Diagnosis not present

## 2021-10-28 DIAGNOSIS — H9042 Sensorineural hearing loss, unilateral, left ear, with unrestricted hearing on the contralateral side: Secondary | ICD-10-CM | POA: Diagnosis not present

## 2021-11-11 ENCOUNTER — Encounter: Payer: Self-pay | Admitting: Family Medicine

## 2021-11-11 ENCOUNTER — Ambulatory Visit (INDEPENDENT_AMBULATORY_CARE_PROVIDER_SITE_OTHER): Payer: BC Managed Care – PPO | Admitting: Family Medicine

## 2021-11-11 DIAGNOSIS — E669 Obesity, unspecified: Secondary | ICD-10-CM | POA: Diagnosis not present

## 2021-11-11 DIAGNOSIS — R7303 Prediabetes: Secondary | ICD-10-CM | POA: Insufficient documentation

## 2021-11-11 LAB — COMPREHENSIVE METABOLIC PANEL
ALT: 25 U/L (ref 0–35)
AST: 16 U/L (ref 0–37)
Albumin: 4.2 g/dL (ref 3.5–5.2)
Alkaline Phosphatase: 95 U/L (ref 39–117)
BUN: 14 mg/dL (ref 6–23)
CO2: 30 mEq/L (ref 19–32)
Calcium: 9.4 mg/dL (ref 8.4–10.5)
Chloride: 102 mEq/L (ref 96–112)
Creatinine, Ser: 0.75 mg/dL (ref 0.40–1.20)
GFR: 92.38 mL/min (ref >60.00)
Glucose, Bld: 85 mg/dL (ref 70–99)
Potassium: 4.1 mEq/L (ref 3.5–5.1)
Sodium: 139 mEq/L (ref 135–145)
Total Bilirubin: 0.3 mg/dL (ref 0.2–1.2)
Total Protein: 7.6 g/dL (ref 6.0–8.3)

## 2021-11-11 LAB — LIPID PANEL
Cholesterol: 198 mg/dL (ref 0–200)
HDL: 64 mg/dL (ref >39.00)
LDL Cholesterol: 116 mg/dL — ABNORMAL HIGH (ref 0–99)
NonHDL: 134.26
Total CHOL/HDL Ratio: 3
Triglycerides: 89 mg/dL (ref 0.0–149.0)
VLDL: 17.8 mg/dL (ref 0.0–40.0)

## 2021-11-11 LAB — HEMOGLOBIN A1C: Hgb A1c MFr Bld: 6.1 % (ref 4.6–6.5)

## 2021-11-11 MED ORDER — SAXENDA 18 MG/3ML ~~LOC~~ SOPN: 0.6000 mg | PEN_INJECTOR | Freq: Every day | SUBCUTANEOUS | 5 refills | Status: DC

## 2021-11-11 NOTE — Patient Instructions (Addendum)
Total Protein requirement: 85 gram protein per day  Total Fiber requirement: 25 gram per day  Total Net carbohydrate: 30 grams or less per day  Total calorie intake: 1700 calories per day  Always take a multivitamin daily.  Strength training is more important than cardio for weight loss.

## 2021-11-11 NOTE — Progress Notes (Unsigned)
Established Patient Office Visit  Subjective   Patient ID: Karen Davis, female    DOB: 03-08-1970  Age: 51 y.o. MRN: [id number]  Chief Complaint  Patient presents with   Establish Care    Patient is here for transition of care visit and for her annual physical exam. Patient states she is feeling fine, has no new symptoms or complaints to report. Patient is disappointed in regaining the weight that she lost. States that she went to the [facility] weight loss center. States that she lost 30 pounds and felt really good, however once the program ended she tried to continue the diet however the weight still returned. She has never tried any prescribed medication to help.   Prediabetes-- history of in the past, needs new A1C today, she reports she is staying away from highly processed foods however she does not get much exercise, states she works from home and sits at a desk most of the time.       Review of Systems  All other systems reviewed and are negative.     Objective:     BP 112/82 (BP Location: Left Arm, Patient Position: Sitting, Cuff Size: Large)   Pulse 73   Temp 98.4 F (36.9 C) (Oral)   Ht '5\' 3"'$  (1.6 m)   Wt 210 lb 3.2 oz (95.3 kg)   SpO2 98%   BMI 37.24 kg/m    Physical Exam Vitals reviewed.  Constitutional:      Appearance: Normal appearance. She is well-groomed. She is obese.  Eyes:     Extraocular Movements: Extraocular movements intact.     Conjunctiva/sclera: Conjunctivae normal.  Cardiovascular:     Rate and Rhythm: Normal rate and regular rhythm.     Pulses: Normal pulses.     Heart sounds: S1 normal and S2 normal.  Pulmonary:     Effort: Pulmonary effort is normal.     Breath sounds: Normal breath sounds and air entry.  Abdominal:     General: Bowel sounds are normal.  Musculoskeletal:        General: Normal range of motion.     Cervical back: Normal range of motion and neck supple.     Right lower leg: No edema.     Left lower leg: No  edema.  Skin:    General: Skin is warm and dry.  Neurological:     Mental Status: She is alert and oriented to person, place, and time. Mental status is at baseline.     Gait: Gait is intact.  Psychiatric:        Mood and Affect: Mood and affect normal.        Speech: Speech normal.        Behavior: Behavior normal.        Judgment: Judgment normal.          The 10-year ASCVD risk score (Arnett DK, et al., 2019) is: 0.9%    Assessment & Plan:   Problem List Items Addressed This Visit       Other   Obesity (BMI 35.0-39.9 without comorbidity) (Chronic)    I have had an extensive (>30 minute)  Conversation today with the patient about healthy eating habits, exercise, calorie and carb goals for sustainable and successful weight loss. I gave the patient caloric and protein daily intake values as well as described the importance of increasing fiber and water intake We discussed medication we could try, will start saxenda injections daily to aid in weight  loss. I gave her goals for potein, fiber and carbohydrate intake. Will order her annual labs today and see back in 3 months for weight check.       Relevant Medications   Liraglutide -Weight Management (SAXENDA) 18 MG/3ML SOPN   Prediabetes (Chronic)    A1C ordered today, see above.      Relevant Orders   Lipid Panel (Completed)   Hemoglobin A1c (Completed)   CMP (Completed)    Return in about 3 months (around 02/10/2022) for Weight check.    Farrel Conners, MD

## 2021-11-12 ENCOUNTER — Telehealth: Payer: Self-pay | Admitting: *Deleted

## 2021-11-12 NOTE — Progress Notes (Signed)
A1C is 6.1 which is still in the prediabetes range, otherwise labs are stable from previous, continue to reduce sugar and starches in diet and increase exercise.

## 2021-11-12 NOTE — Telephone Encounter (Signed)
[facility] faxed a prior authorization request for Saxenda '18mg'$ .  PA was sent to [email or web address]-Key: [id number].

## 2021-11-12 NOTE — Assessment & Plan Note (Signed)
I have had an extensive (>30 minute)  Conversation today with the patient about healthy eating habits, exercise, calorie and carb goals for sustainable and successful weight loss. I gave the patient caloric and protein daily intake values as well as described the importance of increasing fiber and water intake We discussed medication we could try, will start saxenda injections daily to aid in weight loss. I gave her goals for potein, fiber and carbohydrate intake. Will order her annual labs today and see back in 3 months for weight check.

## 2021-11-12 NOTE — Telephone Encounter (Signed)
Fax received stating the request was denied and given to PCP for review. 

## 2021-11-12 NOTE — Assessment & Plan Note (Signed)
A1C ordered today, see above.

## 2021-11-14 NOTE — Telephone Encounter (Signed)
Reviewed, it says we need documentation of trying diet and exercise first for 3 months.

## 2021-11-18 ENCOUNTER — Encounter: Payer: Self-pay | Admitting: Family Medicine

## 2021-11-18 NOTE — Telephone Encounter (Signed)
See My chart message

## 2021-11-21 ENCOUNTER — Telehealth: Payer: Self-pay | Admitting: Family Medicine

## 2021-11-21 NOTE — Telephone Encounter (Signed)
Chelsey from [employer] call and stated she need dr. Legrand Como NPI for this medication Saxenda 18 mg for this pt.

## 2021-12-19 ENCOUNTER — Other Ambulatory Visit: Payer: Self-pay | Admitting: Neurology

## 2021-12-19 DIAGNOSIS — G43009 Migraine without aura, not intractable, without status migrainosus: Secondary | ICD-10-CM

## 2022-02-10 ENCOUNTER — Encounter: Payer: Self-pay | Admitting: Family Medicine

## 2022-02-10 ENCOUNTER — Ambulatory Visit (INDEPENDENT_AMBULATORY_CARE_PROVIDER_SITE_OTHER): Payer: BC Managed Care – PPO | Admitting: Family Medicine

## 2022-02-10 VITALS — BP 102/62 | HR 70 | Temp 98.3°F | Ht 63.0 in | Wt 215.8 lb

## 2022-02-10 DIAGNOSIS — R232 Flushing: Secondary | ICD-10-CM

## 2022-02-10 DIAGNOSIS — E669 Obesity, unspecified: Secondary | ICD-10-CM

## 2022-02-10 DIAGNOSIS — R7303 Prediabetes: Secondary | ICD-10-CM

## 2022-02-10 LAB — POCT GLYCOSYLATED HEMOGLOBIN (HGB A1C): Hemoglobin A1C: 5.6 % (ref 4.0–5.6)

## 2022-02-10 MED ORDER — SAXENDA 18 MG/3ML ~~LOC~~ SOPN: 0.6000 mg | PEN_INJECTOR | Freq: Every day | SUBCUTANEOUS | 5 refills | Status: DC

## 2022-02-10 MED ORDER — VENLAFAXINE HCL ER 37.5 MG PO CP24: 37.5000 mg | ORAL_CAPSULE | Freq: Every day | ORAL | 0 refills | Status: DC

## 2022-02-10 NOTE — Progress Notes (Signed)
Established Patient Office Visit  Subjective   Patient ID: Karen Davis, female    DOB: 1970-08-08  Age: 51 y.o. MRN: [id number]  Chief Complaint  Patient presents with   Follow-up    Patient is here for follow up on obesity and weight loss. She reports that she cut down on sugar significantly, increased protein intake, avoided highly processed foods. Patient reports she was walking every other day, 20 minutes per session. Patient's weight has actually increased by 5 pounds since the last visit. Patient's A1C has improved however. Kirke Shaggy was not approved last viist due to needing documentation of trying diet and exercise first. Patient has tried diet and exercise for 3 months now and has not been successful.      Review of Systems  All other systems reviewed and are negative.     Objective:     BP 102/62 (BP Location: Left Arm, Patient Position: Sitting, Cuff Size: Large)   Pulse 70   Temp 98.3 F (36.8 C) (Oral)   Ht '5\' 3"'$  (1.6 m)   Wt 215 lb 12.8 oz (97.9 kg)   SpO2 99%   BMI 38.23 kg/m    Physical Exam Vitals reviewed.  Constitutional:      Appearance: Normal appearance. She is well-groomed. She is obese.  Eyes:     Conjunctiva/sclera: Conjunctivae normal.  Cardiovascular:     Rate and Rhythm: Normal rate and regular rhythm.     Pulses: Normal pulses.     Heart sounds: S1 normal and S2 normal.  Pulmonary:     Effort: Pulmonary effort is normal.     Breath sounds: Normal breath sounds and air entry.  Abdominal:     General: Bowel sounds are normal.  Neurological:     Mental Status: She is alert and oriented to person, place, and time. Mental status is at baseline.     Gait: Gait is intact.  Psychiatric:        Mood and Affect: Mood and affect normal.        Speech: Speech normal.        Behavior: Behavior normal.        Judgment: Judgment normal.      Results for orders placed or performed in visit on 02/10/22  POC HgB A1c  Result Value Ref  Range   Hemoglobin A1C 5.6 4.0 - 5.6 %   HbA1c POC (<> result, manual entry)     HbA1c, POC (prediabetic range)     HbA1c, POC (controlled diabetic range)        The 10-year ASCVD risk score (Arnett DK, et al., 2019) is: 0.7%    Assessment & Plan:   Problem List Items Addressed This Visit       Unprioritized   Obesity (BMI 35.0-39.9 without comorbidity) (Chronic)    Patient has tried diet and exercise for 3 months and it was ineffective, in fact she has gained 5 pounds since her last visit. Her A1C has improved however with her dietary changes, down to 5.6. will try to get the saxenda approved since she did participate in diet and exercise for 3 months and it was not effective.If the saxenda is not approved then we discussed starting phentermine and topiramate and she is agreeable to this plan.       Relevant Medications   Liraglutide -Weight Management (SAXENDA) 18 MG/3ML SOPN   Prediabetes - Primary (Chronic)    Much improved today, with reducing sugar in her diet, down to  5.6, I encouraged her to continue low sugar/ low starch diet.      Relevant Medications   Liraglutide -Weight Management (SAXENDA) 18 MG/3ML SOPN   Other Relevant Orders   POC HgB A1c (Completed)   Other Visit Diagnoses     Hot flashes       Relevant Medications   Patient wants to try coming off the effexor, will reduce her dose to 37.5 mg daily for the next month so she can try coming off the medication. venlafaxine XR (EFFEXOR XR) 37.5 MG 24 hr capsule       Return in about 3 months (around 05/12/2022) for follow up weight check.    Farrel Conners, MD

## 2022-02-10 NOTE — Assessment & Plan Note (Signed)
Much improved today, with reducing sugar in her diet, down to 5.6, I encouraged her to continue low sugar/ low starch diet.

## 2022-02-10 NOTE — Assessment & Plan Note (Signed)
Patient has tried diet and exercise for 3 months and it was ineffective, in fact she has gained 5 pounds since her last visit. Her A1C has improved, down to 5.6. will try to get the saxend approved since she did participate in diet and exercise for 3 months.

## 2022-02-14 DIAGNOSIS — H40023 Open angle with borderline findings, high risk, bilateral: Secondary | ICD-10-CM | POA: Diagnosis not present

## 2022-02-17 ENCOUNTER — Telehealth: Payer: Self-pay | Admitting: Family Medicine

## 2022-02-17 NOTE — Telephone Encounter (Signed)
Needs clarification on quantity for Liraglutide -Weight Management (SAXENDA) 18 MG/3ML SOPN  ref [id number]

## 2022-02-18 ENCOUNTER — Telehealth: Payer: Self-pay | Admitting: Family Medicine

## 2022-02-18 NOTE — Telephone Encounter (Signed)
She needs enough for 1.8 mg daily for 30 days.

## 2022-02-18 NOTE — Telephone Encounter (Signed)
See prior phone note. 

## 2022-02-18 NOTE — Telephone Encounter (Signed)
Spoke with Lovena Le, pharmacy tech on the clarification line at [phone] and informed her of the message below.   Lovena Le stated the Rx was written with directions for titrating and questioned if this is correct and requested clarification?  Lovena Le also stated they are a mail order pharmacy and the  patient can be given a 90-day supply of the medication.  Message sent to PCP.

## 2022-02-18 NOTE — Telephone Encounter (Signed)
States this is the final attempt ref # [id number]  Liraglutide -Weight Management (SAXENDA) 18 MG/3ML SOPN needs clarification on quantity

## 2022-02-19 NOTE — Telephone Encounter (Signed)
Spoke with Jarrett Soho at the clarification line and informed her of the order as below.

## 2022-02-19 NOTE — Telephone Encounter (Signed)
Yes she can also get 90 days of the medication that's fine

## 2022-02-21 ENCOUNTER — Encounter: Payer: Self-pay | Admitting: Family Medicine

## 2022-02-21 ENCOUNTER — Telehealth: Payer: Self-pay

## 2022-02-21 ENCOUNTER — Other Ambulatory Visit (HOSPITAL_COMMUNITY): Payer: Self-pay

## 2022-02-21 DIAGNOSIS — E669 Obesity, unspecified: Secondary | ICD-10-CM

## 2022-02-21 NOTE — Telephone Encounter (Signed)
Patient Advocate Encounter Prior authorization is required for Saxenda '18MG'$ /3ML pen-injectors. PA submitted and APPROVED on 02/21/2022.  Key [id number] Effective: 01/22/2022 - 06/21/2022

## 2022-02-26 MED ORDER — PHENTERMINE HCL 15 MG PO CAPS: 15.0000 mg | ORAL_CAPSULE | ORAL | 0 refills | Status: DC

## 2022-02-26 MED ORDER — TOPIRAMATE 25 MG PO TABS: 25.0000 mg | ORAL_TABLET | Freq: Every day | ORAL | 5 refills | Status: DC

## 2022-03-11 NOTE — Progress Notes (Unsigned)
NEUROLOGY FOLLOW UP OFFICE NOTE  Karen Davis [id number]  Assessment/Plan:   1  Migraine without aura, without status migrainosus, not intractable 2  Memory deficits, ***   1.  Migraine prevention:  [name] '140mg'$  Q4w 2.  Migraine rescue:  Flurbiprofen '100mg'$  3.  Limit use of pain relievers to no more than 2 days out of week to prevent risk of rebound or medication-overuse headache. 4.  Keep headache diary 5.  Follow up in 1 year ***   Subjective:  Karen Davis is a 52 year old right-handed woman who follows up for migraines.   UPDATE: Intensity:  Moderate to severe Duration:  20 minutes Frequency:  1 migraine a month, may have a mild non-throbbing headache 2 days a month.   Last year, she reported memory problems.  She has previously been diagnosed with ADD and she feels it is worse.  Cannot multitask.  Her husband has to assist her while she cooks.  No preceding illness.  Has not had COVID as far as she knows.  No new stressors.  No new medications.  Labs from 03/12/2021 revealed TSH of 2.03 but B12 of 245.  She was advised to start B12 1057mg daily.  Repeat B12 level on 09/09/2021 was 679.  ***   Current NSAIDS:  flurbiprofen Current analgesics:  Tylenol Sinus Current triptans:  none Current ergotamine:  None Current anti-emetic:  None Current muscle relaxants:  tizandine Current anti-anxiolytic:  None Current sleep aide:  None Current Antihypertensive medications:  None Current Antidepressant medications:  venlafaxine XR 37.'5mg'$  QD Current Anticonvulsant medications:  None Current anti-CGRP:  [name] '140mg'$   Current Vitamins/Herbal/Supplements:  riboflavin, ferrous sulfate, D3, C, tart cherry Current Antihistamines/Decongestants:  None Other therapy:  none Hormone/birth control:  None   Caffeine:  1 cup coffee and maybe 1 soda daily Diet:  hydrates Exercise:  Not routine Depression:  no; Anxiety:  no Other pain:  Neck pain Sleep hygiene:  ok    HISTORY: Onset:  2013 or 2014 Location:  Right maxillary/periorbital region, sometimes radiating to occipital region Quality:  Pressure, progressing to stabbing Initial Intensity:  10/10; May:  8/10 Aura:  no Prodrome:  no Associated symptoms:  Nausea, photophobia Initial Duration:  30 minutes with sumatriptan, otherwise several hours; May:  Usually 1 hour Initial Frequency:  6 days per month Triggers:  Change in barometric pressure, heat Relieving factors:  None Activity:  Needs to lay down   Past NSAIDS:  flurbiprofen, oxaprozin, ibuprofen, ketoprofen, etodolac, Mobic Past analgesics:  Excedrin, Tylenol Past abortive triptans:  sumatriptan '100mg'$ , rizatriptan '10mg'$  Past muscle relaxants:  no Past anti-emetic:  no Past anti-anxiolytic:  no Past sleep aide:  no  Past antihypertensive medications:  verapamil HCL ER '120mg'$  (was effective but she stopped due to feeling depressed) Past antidepressant medications:  Nortriptyline '25mg'$  Past anticonvulsant medications:  Topiramate (effective but caused diarrhea) Past anti-CGRP:  [name] '140mg'$  (effective but no longer covered by insurance); [name] (ineffective).   Other past therapy:  Reyvow Past vitamins/Herbal/Supplements:  riboflavin Past antihistamines/decongestants:  no   Family history of headache:  Mother had migraines.  Brother had stroke in his 437s  Paternal great grandfather had stroke in his 433s   MRI and MRA of head and MRA of neck from 11/12/13 were personally reviewed and were unremarkable.   She also reports bilateral hand numbness and tingling that can radiate up to above elbows.  They are prominent when in bed and often wakes her up.  She denies neck pain or weakness.  NCV-EMG was normal.  No evidence of carpal tunnel.  She does wear wrist splints at night when it flares up and it helps.   She also has chronic low back pain since slipping and falling on her coccyx around 2015.  She feels right lower back pain radiating down  into her buttocks and associated with numbness and tingling of her anterior thigh.  There is no weakness.  She had PT for low back pain.  Traction helped.  PAST MEDICAL HISTORY: Past Medical History:  Diagnosis Date   Alcohol abuse    Anemia    Depression    Migraines    Pre-diabetes     MEDICATIONS: Current Outpatient Medications on File Prior to Visit  Medication Sig Dispense Refill   [name] 140 MG/ML SOAJ INJECT 140 MG UNDER THE SKIN EVERY 30 DAYS 3 mL 3   Ascorbic Acid (VITAMIN C PO) Take 500 mg by mouth daily.     Cholecalciferol (VITAMIN D3 PO) Take 2,000 microcuries/1.5m by mouth daily.     Ferrous Sulfate (IRON PO) Take 65 mg by mouth once a week.     flurbiprofen (ANSAID) 100 MG tablet Take 1 tablet (100 mg total) by mouth every 8 (eight) hours as needed (Maximum 3 tablets in 24 hours). 72 tablet 1   Liraglutide -Weight Management (SAXENDA) 18 MG/3ML SOPN Inject 0.6 mg into the skin daily. Inject 0.6 mg into the skin daily for 7 days, then increase to 1.2 mg injection daily for 7 days. Then increase 1.8 mg injections daily. 6 mL 5   mupirocin ointment (BACTROBAN) 2 %      phentermine 15 MG capsule Take 1 capsule (15 mg total) by mouth every morning. 30 capsule 0   [START ON 03/28/2022] phentermine 15 MG capsule Take 1 capsule (15 mg total) by mouth every morning. 30 capsule 0   [START ON 04/27/2022] phentermine 15 MG capsule Take 1 capsule (15 mg total) by mouth every morning. 30 capsule 0   topiramate (TOPAMAX) 25 MG tablet Take 1 tablet (25 mg total) by mouth at bedtime. 30 tablet 5   venlafaxine XR (EFFEXOR XR) 37.5 MG 24 hr capsule Take 1 capsule (37.5 mg total) by mouth daily with breakfast. 30 capsule 0   No current facility-administered medications on file prior to visit.    ALLERGIES: Allergies  Allergen Reactions   Vantin [Cefpodoxime] Other (See Comments)    migraine    FAMILY HISTORY: Family History  Problem Relation Age of Onset   Melanoma Mother     Liver disease Father    Depression Father    High blood pressure Father    High Cholesterol Father    Alcohol abuse Father    Stroke Brother [phone]  Cancer Brother        skin   Skin cancer Maternal Grandmother    Leukemia Maternal Grandfather    Arthritis Paternal Grandmother    Hearing loss Paternal Grandmother    Hearing loss Paternal Grandfather    Colon cancer Neg Hx    Colon polyps Neg Hx    Esophageal cancer Neg Hx    Rectal cancer Neg Hx    Stomach cancer Neg Hx       Objective:  *** General: No acute distress.  Patient appears well-groomed.   Head:  Normocephalic/atraumatic Eyes:  Fundi examined but not visualized Neck: supple, no paraspinal tenderness, full range of motion Heart:  Regular rate and rhythm Lungs:  Clear to auscultation bilaterally Back: No  paraspinal tenderness Neurological Exam: alert and oriented to person, place, and time.  Speech fluent and not dysarthric, language intact.  CN II-XII intact. Bulk and tone normal, muscle strength 5/5 throughout.  Sensation to light touch intact.  Deep tendon reflexes 2+ throughout.  Finger to nose testing intact.  Gait normal, Romberg negative.   Metta Clines, DO  CC:  Loralyn Freshwater, MD

## 2022-03-12 ENCOUNTER — Ambulatory Visit (INDEPENDENT_AMBULATORY_CARE_PROVIDER_SITE_OTHER): Payer: BC Managed Care – PPO | Admitting: Neurology

## 2022-03-12 ENCOUNTER — Encounter: Payer: Self-pay | Admitting: Neurology

## 2022-03-12 VITALS — BP 141/87 | HR 74 | Ht 64.0 in | Wt 216.6 lb

## 2022-03-12 DIAGNOSIS — G43009 Migraine without aura, not intractable, without status migrainosus: Secondary | ICD-10-CM

## 2022-03-12 NOTE — Patient Instructions (Signed)
[name] Flurbiprofen Follow up one year

## 2022-04-02 ENCOUNTER — Other Ambulatory Visit (HOSPITAL_COMMUNITY): Payer: Self-pay

## 2022-04-03 ENCOUNTER — Telehealth: Payer: Self-pay

## 2022-04-03 NOTE — Telephone Encounter (Signed)
PA request received via CMM for [name] '140MG'$ /ML auto-injectors  PA has been submitted to [employer] and has been APPROVED from 04/03/2022-04/03/2023  Key: [facility]

## 2022-04-30 ENCOUNTER — Other Ambulatory Visit: Payer: Self-pay | Admitting: Family Medicine

## 2022-04-30 ENCOUNTER — Encounter: Payer: Self-pay | Admitting: Family Medicine

## 2022-04-30 DIAGNOSIS — E669 Obesity, unspecified: Secondary | ICD-10-CM

## 2022-04-30 MED ORDER — TOPIRAMATE 50 MG PO TABS: 50.0000 mg | ORAL_TABLET | Freq: Every day | ORAL | 1 refills | Status: DC

## 2022-04-30 MED ORDER — PHENTERMINE HCL 15 MG PO CAPS: 15.0000 mg | ORAL_CAPSULE | ORAL | 0 refills | Status: DC

## 2022-05-12 ENCOUNTER — Ambulatory Visit (INDEPENDENT_AMBULATORY_CARE_PROVIDER_SITE_OTHER): Payer: BC Managed Care – PPO | Admitting: Family Medicine

## 2022-05-12 VITALS — BP 122/84 | HR 87 | Temp 97.8°F | Ht 64.0 in | Wt 223.9 lb

## 2022-05-12 DIAGNOSIS — E669 Obesity, unspecified: Secondary | ICD-10-CM

## 2022-05-12 DIAGNOSIS — R7303 Prediabetes: Secondary | ICD-10-CM | POA: Diagnosis not present

## 2022-05-12 LAB — POCT GLYCOSYLATED HEMOGLOBIN (HGB A1C): Hemoglobin A1C: 5.9 % — AB (ref 4.0–5.6)

## 2022-05-12 MED ORDER — ZEPBOUND 2.5 MG/0.5ML ~~LOC~~ SOAJ: 2.5000 mg | SUBCUTANEOUS | 1 refills | Status: DC

## 2022-05-12 NOTE — Patient Instructions (Signed)
Zepbound has a coupon that you can download on the website to reduce the copay.

## 2022-05-12 NOTE — Assessment & Plan Note (Addendum)
It has now been 6 months since she has started her weight loss plan, I have been supervising her progress and giving dietary education and support. Since she has not been successful with phentermine and topiramate so will send in an rx for Zepbound and she will continue her calorie/ carb restriction and the exercise plan that we discussed previously. These are the previous goals I gave her in Sept:  Total Protein requirement: 85 gram protein per day   Total Fiber requirement: 25 gram per day   Total Net carbohydrate: 30 grams or less per day   Total calorie intake: 1700 calories per day   Always take a multivitamin daily.   Strength training is more important than cardio for weight loss.

## 2022-05-12 NOTE — Assessment & Plan Note (Signed)
Comorbid condition to the obesity, continue reducing sugar and starches in her diet. RTC in 3 months

## 2022-05-12 NOTE — Progress Notes (Signed)
Established Patient Office Visit  Subjective   Patient ID: Karen Davis, female    DOB: 1970/04/13  Age: 52 y.o. MRN: [id number]  Chief Complaint  Patient presents with   Medical Management of Chronic Issues    Pt is here for follow up today. She reports that her mother was in a car accident and she has been in [facility] caring for her and hasn't been able to adhere to her diet and exercise plans because she has been caring for her.   Prediabetes- A1C performed in office today and increased from the last visit.   Obesity-- pt has been taking the phentermine and topiramate since the last visit, however her weight has increased since the last visit. She reports she is tolerating the medication, it helps some with her appetite but it could be a little stronger.     Current Outpatient Medications  Medication Instructions   [name] 140 MG/ML SOAJ INJECT 140 MG UNDER THE SKIN EVERY 30 DAYS   Ascorbic Acid (VITAMIN C PO) 500 mg, Oral, Daily   Ferrous Sulfate (IRON PO) 65 mg, Oral, 2 times weekly   flurbiprofen (ANSAID) 100 mg, Oral, Every 8 hours PRN   phentermine 15 mg, Oral, BH-each morning   topiramate (TOPAMAX) 50 mg, Oral, Daily at bedtime   venlafaxine XR (EFFEXOR XR) 37.5 mg, Oral, Daily with breakfast   Zepbound 2.5 mg, Subcutaneous, Weekly     Patient Active Problem List   Diagnosis Date Noted   Obesity (BMI 35.0-39.9 without comorbidity) 11/11/2021   Prediabetes 11/11/2021   Anemia 09/16/2019   Arthralgia of right elbow 05/19/2018   Pain in finger of left hand 05/05/2017      Review of Systems  All other systems reviewed and are negative.     Objective:     BP 122/84 (BP Location: Left Arm, Patient Position: Sitting, Cuff Size: Large)   Pulse 87   Temp 97.8 F (36.6 C) (Oral)   Ht '5\' 4"'$  (1.626 m)   Wt 223 lb 14.4 oz (101.6 kg)   SpO2 96%   BMI 38.43 kg/m    Physical Exam Vitals reviewed.  Constitutional:      Appearance: Normal appearance. She is  well-groomed. She is obese.  Eyes:     Conjunctiva/sclera: Conjunctivae normal.  Neck:     Thyroid: No thyromegaly.  Cardiovascular:     Rate and Rhythm: Normal rate and regular rhythm.     Pulses: Normal pulses.     Heart sounds: S1 normal and S2 normal.  Pulmonary:     Effort: Pulmonary effort is normal.     Breath sounds: Normal breath sounds and air entry.  Abdominal:     General: Bowel sounds are normal.  Musculoskeletal:     Right lower leg: No edema.     Left lower leg: No edema.  Neurological:     Mental Status: She is alert and oriented to person, place, and time. Mental status is at baseline.     Gait: Gait is intact.  Psychiatric:        Mood and Affect: Mood and affect normal.        Speech: Speech normal.        Behavior: Behavior normal.        Judgment: Judgment normal.      Results for orders placed or performed in visit on 05/12/22  POC HgB A1c  Result Value Ref Range   Hemoglobin A1C 5.9 (A) 4.0 - 5.6 %  HbA1c POC (<> result, manual entry)     HbA1c, POC (prediabetic range)     HbA1c, POC (controlled diabetic range)        The 10-year ASCVD risk score (Arnett DK, et al., 2019) is: 1%    Assessment & Plan:   Problem List Items Addressed This Visit       Unprioritized   Obesity (BMI 35.0-39.9 without comorbidity) (Chronic)    It has now been 6 months since she has started her weight loss plan, I have been supervising her progress and giving dietary education and support. Since she has not been successful with phentermine and topiramate so will send in an rx for Zepbound and she will continue her calorie/ carb restriction and the exercise plan that we discussed previously. These are the previous goals I gave her in Sept:  Total Protein requirement: 85 gram protein per day   Total Fiber requirement: 25 gram per day   Total Net carbohydrate: 30 grams or less per day   Total calorie intake: 1700 calories per day   Always take a multivitamin  daily.   Strength training is more important than cardio for weight loss.          Relevant Medications   tirzepatide (ZEPBOUND) 2.5 MG/0.5ML Pen   Prediabetes - Primary (Chronic)    Comorbid condition to the obesity, continue reducing sugar and starches in her diet. RTC in 3 months      Relevant Medications   tirzepatide (ZEPBOUND) 2.5 MG/0.5ML Pen   Other Relevant Orders   POC HgB A1c (Completed)    No follow-ups on file.    Farrel Conners, MD

## 2022-05-20 ENCOUNTER — Encounter: Payer: Self-pay | Admitting: Family Medicine

## 2022-06-23 ENCOUNTER — Telehealth: Payer: Self-pay | Admitting: *Deleted

## 2022-06-23 DIAGNOSIS — E669 Obesity, unspecified: Secondary | ICD-10-CM

## 2022-06-23 NOTE — Telephone Encounter (Signed)
[facility] faxed a prior auth request for Zepbound 2.5mg .  I called [facility], spoke with the pharmacist Randa Evens as the PA was previously sent on 3/20 and approved through 01/16/2023.  Randa Evens stated she tried filling the Rx and a message was received stating "plan limit is excluded".  Message sent to PCP.

## 2022-06-24 MED ORDER — ZEPBOUND 7.5 MG/0.5ML ~~LOC~~ SOAJ
7.5000 mg | SUBCUTANEOUS | 0 refills | Status: DC
Start: 2022-06-24 — End: 2022-07-01

## 2022-06-24 MED ORDER — ZEPBOUND 5 MG/0.5ML ~~LOC~~ SOAJ: 5.0000 mg | SUBCUTANEOUS | 0 refills | Status: DC

## 2022-06-24 MED ORDER — ZEPBOUND 10 MG/0.5ML ~~LOC~~ SOAJ
10.0000 mg | SUBCUTANEOUS | 0 refills | Status: DC
Start: 2022-06-24 — End: 2022-07-01

## 2022-06-24 NOTE — Telephone Encounter (Signed)
Spoke with the patient and informed her of the message below.  Patient stated she talked with her insurance company and was told the 2.5mg  dose is only covered for a month and she will need to increase the dose.  Message sent to PCP.

## 2022-06-24 NOTE — Telephone Encounter (Signed)
I would have the patient call [employer] and find out why this is happening-- tell her the message that the pharmacist got an have the patient call and find out why.

## 2022-06-30 ENCOUNTER — Encounter: Payer: Self-pay | Admitting: Family Medicine

## 2022-06-30 DIAGNOSIS — E669 Obesity, unspecified: Secondary | ICD-10-CM

## 2022-07-01 MED ORDER — ZEPBOUND 7.5 MG/0.5ML ~~LOC~~ SOAJ
7.5000 mg | SUBCUTANEOUS | 0 refills | Status: DC
Start: 2022-07-01 — End: 2022-08-19

## 2022-07-01 MED ORDER — ZEPBOUND 10 MG/0.5ML ~~LOC~~ SOAJ
10.0000 mg | SUBCUTANEOUS | 0 refills | Status: DC
Start: 2022-07-01 — End: 2022-08-19

## 2022-07-01 MED ORDER — ZEPBOUND 5 MG/0.5ML ~~LOC~~ SOAJ
5.0000 mg | SUBCUTANEOUS | 0 refills | Status: DC
Start: 2022-07-01 — End: 2022-07-08

## 2022-07-08 MED ORDER — ZEPBOUND 5 MG/0.5ML ~~LOC~~ SOAJ
5.0000 mg | SUBCUTANEOUS | 0 refills | Status: DC
Start: 2022-07-08 — End: 2022-08-19

## 2022-07-08 NOTE — Addendum Note (Signed)
Addended by: Karie Georges on: 07/08/2022 12:17 PM   Modules accepted: Orders

## 2022-08-12 ENCOUNTER — Ambulatory Visit: Payer: BC Managed Care – PPO | Admitting: Family Medicine

## 2022-08-19 ENCOUNTER — Encounter: Payer: Self-pay | Admitting: Family Medicine

## 2022-08-19 ENCOUNTER — Ambulatory Visit (INDEPENDENT_AMBULATORY_CARE_PROVIDER_SITE_OTHER): Payer: BC Managed Care – PPO | Admitting: Family Medicine

## 2022-08-19 VITALS — BP 134/86 | HR 95 | Temp 98.0°F | Ht 64.0 in | Wt 207.7 lb

## 2022-08-19 DIAGNOSIS — D649 Anemia, unspecified: Secondary | ICD-10-CM | POA: Diagnosis not present

## 2022-08-19 DIAGNOSIS — R7303 Prediabetes: Secondary | ICD-10-CM | POA: Diagnosis not present

## 2022-08-19 DIAGNOSIS — E669 Obesity, unspecified: Secondary | ICD-10-CM

## 2022-08-19 LAB — COMPREHENSIVE METABOLIC PANEL
ALT: 15 U/L (ref 0–35)
AST: 13 U/L (ref 0–37)
Albumin: 4.4 g/dL (ref 3.5–5.2)
Alkaline Phosphatase: 92 U/L (ref 39–117)
BUN: 16 mg/dL (ref 6–23)
CO2: 27 mEq/L (ref 19–32)
Calcium: 9.5 mg/dL (ref 8.4–10.5)
Chloride: 101 mEq/L (ref 96–112)
Creatinine, Ser: 0.8 mg/dL (ref 0.40–1.20)
GFR: 85.03 mL/min (ref >60.00)
Glucose, Bld: 87 mg/dL (ref 70–99)
Potassium: 4 mEq/L (ref 3.5–5.1)
Sodium: 138 mEq/L (ref 135–145)
Total Bilirubin: 0.4 mg/dL (ref 0.2–1.2)
Total Protein: 8.1 g/dL (ref 6.0–8.3)

## 2022-08-19 LAB — CBC
HCT: 40.8 % (ref 36.0–46.0)
Hemoglobin: 13.3 g/dL (ref 12.0–15.0)
MCHC: 32.6 g/dL (ref 30.0–36.0)
MCV: 82.2 fl (ref 78.0–100.0)
Platelets: 379 10*3/uL (ref 150.0–400.0)
RBC: 4.96 Mil/uL (ref 3.87–5.11)
RDW: 15.9 % — ABNORMAL HIGH (ref 11.5–15.5)
WBC: 7 10*3/uL (ref 4.0–10.5)

## 2022-08-19 LAB — LIPID PANEL
Cholesterol: 167 mg/dL (ref 0–200)
HDL: 53.5 mg/dL (ref >39.00)
LDL Cholesterol: 100 mg/dL — ABNORMAL HIGH (ref 0–99)
NonHDL: 113.58
Total CHOL/HDL Ratio: 3
Triglycerides: 70 mg/dL (ref 0.0–149.0)
VLDL: 14 mg/dL (ref 0.0–40.0)

## 2022-08-19 MED ORDER — ZEPBOUND 7.5 MG/0.5ML ~~LOC~~ SOAJ
7.5000 mg | SUBCUTANEOUS | 0 refills | Status: DC
Start: 2022-08-19 — End: 2022-08-19

## 2022-08-19 MED ORDER — ZEPBOUND 7.5 MG/0.5ML ~~LOC~~ SOAJ: 7.5000 mg | SUBCUTANEOUS | 0 refills | Status: DC

## 2022-08-19 MED ORDER — ZEPBOUND 10 MG/0.5ML ~~LOC~~ SOAJ: 10.0000 mg | SUBCUTANEOUS | 0 refills | Status: DC

## 2022-08-19 NOTE — Progress Notes (Signed)
Complete physical exam  Patient: Karen Davis   DOB: Sep 10, 1970   52 y.o. Female  MRN: [id number]  Subjective:    Chief Complaint  Patient presents with   Medical Management of Chronic Issues    Karen Davis is a 52 y.o. female who presents today for a complete physical exam. She reports consuming a general diet. Home exercise routine includes not a lot given that she is caring for her ailing mother at home. She generally feels well. She reports sleeping well. She does not have additional problems to discuss today.    Most recent fall risk assessment:    03/12/2022    8:50 AM  Fall Risk   Falls in the past year? 0  Number falls in past yr: 0  Injury with Fall? 0  Follow up Falls evaluation completed     Most recent depression screenings:    05/12/2022   10:53 AM 02/10/2022    8:04 AM  PHQ 2/9 Scores  PHQ - 2 Score 0 0  PHQ- 9 Score 5 5    Vision:Within last year and Dental: No current dental problems and Receives regular dental care  Patient Active Problem List   Diagnosis Date Noted   Obesity (BMI 35.0-39.9 without comorbidity) 11/11/2021   Prediabetes 11/11/2021   Anemia 09/16/2019   Arthralgia of right elbow 05/19/2018   Pain in finger of left hand 05/05/2017      Patient Care Team: Karie Georges, MD as PCP - General (Family Medicine) Dermatology, [place], [place], [place] (Optometry) Drema Dallas, DO as Consulting Physician (Neurology) Zenovia Jordan, MD as Consulting Physician (Rheumatology) Candice Camp, MD as Consulting Physician (Obstetrics and Gynecology)   Outpatient Medications Prior to Visit  Medication Sig   [name] 140 MG/ML SOAJ INJECT 140 MG UNDER THE SKIN EVERY 30 DAYS   Ferrous Sulfate (IRON PO) Take 65 mg by mouth 2 (two) times a week.   flurbiprofen (ANSAID) 100 MG tablet Take 1 tablet (100 mg total) by mouth every 8 (eight) hours as needed (Maximum 3 tablets in 24 hours).   [DISCONTINUED] tirzepatide (ZEPBOUND) 10  MG/0.5ML Pen Inject 10 mg into the skin once a week.   [DISCONTINUED] tirzepatide (ZEPBOUND) 5 MG/0.5ML Pen Inject 5 mg into the skin once a week.   [DISCONTINUED] tirzepatide (ZEPBOUND) 7.5 MG/0.5ML Pen Inject 7.5 mg into the skin once a week.   [DISCONTINUED] Ascorbic Acid (VITAMIN C PO) Take 500 mg by mouth daily.   No facility-administered medications prior to visit.    Review of Systems  HENT:  Negative for hearing loss.   Eyes:  Negative for blurred vision.  Respiratory:  Negative for shortness of breath.   Cardiovascular:  Negative for chest pain.  Gastrointestinal: Negative.   Genitourinary: Negative.   Musculoskeletal:  Negative for back pain.  Neurological:  Negative for headaches.  Psychiatric/Behavioral:  Negative for depression.   All other systems reviewed and are negative.      Objective:     BP 134/86 (BP Location: Left Arm, Patient Position: Sitting, Cuff Size: Large)   Pulse 95   Temp 98 F (36.7 C) (Oral)   Ht 5\' 4"  (1.626 m)   Wt 207 lb 11.2 oz (94.2 kg)   SpO2 100%   BMI 35.65 kg/m     Physical Exam Vitals reviewed.  Constitutional:      Appearance: Normal appearance. She is well-groomed. She is obese.  HENT:     Right Ear: Tympanic membrane normal.  Left Ear: Tympanic membrane normal.     Mouth/Throat:     Mouth: Mucous membranes are moist.     Pharynx: No posterior oropharyngeal erythema.  Eyes:     Conjunctiva/sclera: Conjunctivae normal.  Neck:     Thyroid: No thyromegaly.  Cardiovascular:     Rate and Rhythm: Normal rate and regular rhythm.     Pulses: Normal pulses.     Heart sounds: S1 normal and S2 normal.  Pulmonary:     Effort: Pulmonary effort is normal.     Breath sounds: Normal breath sounds and air entry.  Abdominal:     General: Bowel sounds are normal.  Musculoskeletal:     Right lower leg: No edema.     Left lower leg: No edema.  Lymphadenopathy:     Cervical: No cervical adenopathy.  Neurological:     Mental  Status: She is alert and oriented to person, place, and time. Mental status is at baseline.     Gait: Gait is intact.  Psychiatric:        Mood and Affect: Mood and affect normal.        Speech: Speech normal.        Behavior: Behavior normal.        Judgment: Judgment normal.      No results found for any visits on 08/19/22.      Assessment & Plan:    Routine Health Maintenance and Physical Exam  Immunization History  Administered Date(s) Administered   Influenza Inj Mdck Quad Pf 12/06/2016   Influenza, Quadrivalent, Recombinant, Inj, Pf 11/22/2018   Influenza,inj,Quad PF,6+ Mos 12/23/2017   Influenza-Unspecified 12/02/2019, 12/16/2020   Moderna Sars-Covid-2 Vaccination 06/11/2019, 07/09/2019, 02/08/2020   Tdap 10/19/2018   Zoster Recombinat (Shingrix) 12/16/2020, 03/18/2021    Health Maintenance  Topic Date Due   Hepatitis C Screening  11/12/2022 (Originally 09/28/1988)   HIV Screening  11/12/2022 (Originally 09/28/1985)   COVID-19 Vaccine (4 - 2023-24 season) 05/12/2023 (Originally 11/01/2021)   INFLUENZA VACCINE  10/02/2022   MAMMOGRAM  10/30/2022   PAP SMEAR-Modifier  09/06/2023   DTaP/Tdap/Td (2 - Td or Tdap) 10/18/2028   Colonoscopy  10/25/2030   Zoster Vaccines- Shingrix  Completed   HPV VACCINES  Aged Out    Discussed health benefits of physical activity, and encouraged her to engage in regular exercise appropriate for her age and condition.  Prediabetes -     Comprehensive metabolic panel  Obesity (BMI 35.0-39.9 without comorbidity) -     Lipid panel -     Zepbound; Inject 10 mg into the skin once a week.  Dispense: 2 mL; Refill: 0 -     Zepbound; Inject 7.5 mg into the skin once a week.  Dispense: 2 mL; Refill: 0  Anemia, unspecified type -     CBC  Normal physical exam findings today, checking her annual labs for surveillance, RTC 6 months for weight loss visit. We discussed healthy eating and increasing exercise to 30 minutes 5 times per week. Handouts  given.   Return in about 6 months (around 02/18/2023) for weight loss, A1C check.     Karie Georges, MD

## 2022-10-24 DIAGNOSIS — R61 Generalized hyperhidrosis: Secondary | ICD-10-CM | POA: Diagnosis not present

## 2022-10-24 DIAGNOSIS — Z01419 Encounter for gynecological examination (general) (routine) without abnormal findings: Secondary | ICD-10-CM | POA: Diagnosis not present

## 2022-10-24 DIAGNOSIS — Z1272 Encounter for screening for malignant neoplasm of vagina: Secondary | ICD-10-CM | POA: Diagnosis not present

## 2022-10-24 DIAGNOSIS — Z6834 Body mass index (BMI) 34.0-34.9, adult: Secondary | ICD-10-CM | POA: Diagnosis not present

## 2022-10-24 DIAGNOSIS — Z1231 Encounter for screening mammogram for malignant neoplasm of breast: Secondary | ICD-10-CM | POA: Diagnosis not present

## 2022-10-24 LAB — HM MAMMOGRAPHY

## 2022-11-23 DIAGNOSIS — M79674 Pain in right toe(s): Secondary | ICD-10-CM | POA: Diagnosis not present

## 2022-11-24 ENCOUNTER — Encounter: Payer: Self-pay | Admitting: Family Medicine

## 2022-11-24 DIAGNOSIS — E669 Obesity, unspecified: Secondary | ICD-10-CM

## 2022-11-24 MED ORDER — ZEPBOUND 10 MG/0.5ML ~~LOC~~ SOAJ: 10.0000 mg | SUBCUTANEOUS | 0 refills | Status: DC

## 2022-12-14 ENCOUNTER — Encounter: Payer: Self-pay | Admitting: Family Medicine

## 2022-12-15 MED ORDER — ZEPBOUND 12.5 MG/0.5ML ~~LOC~~ SOAJ: 12.5000 mg | SUBCUTANEOUS | 0 refills | Status: DC

## 2022-12-15 NOTE — Telephone Encounter (Signed)
Ok to [facility]

## 2022-12-19 IMAGING — MG MM DIGITAL SCREENING BILAT W/ TOMO AND CAD
8 series · 8 of 24 positions shown · non-contrast
Comparison: Previous exam(s).

CLINICAL DATA: Screening.

EXAM:
DIGITAL SCREENING BILATERAL MAMMOGRAM WITH TOMOSYNTHESIS AND CAD
TECHNIQUE: Bilateral screening digital craniocaudal and mediolateral oblique
mammograms were obtained. Bilateral screening digital breast
tomosynthesis was performed. The images were evaluated with
computer-aided detection.

[R MLO synth-2D]
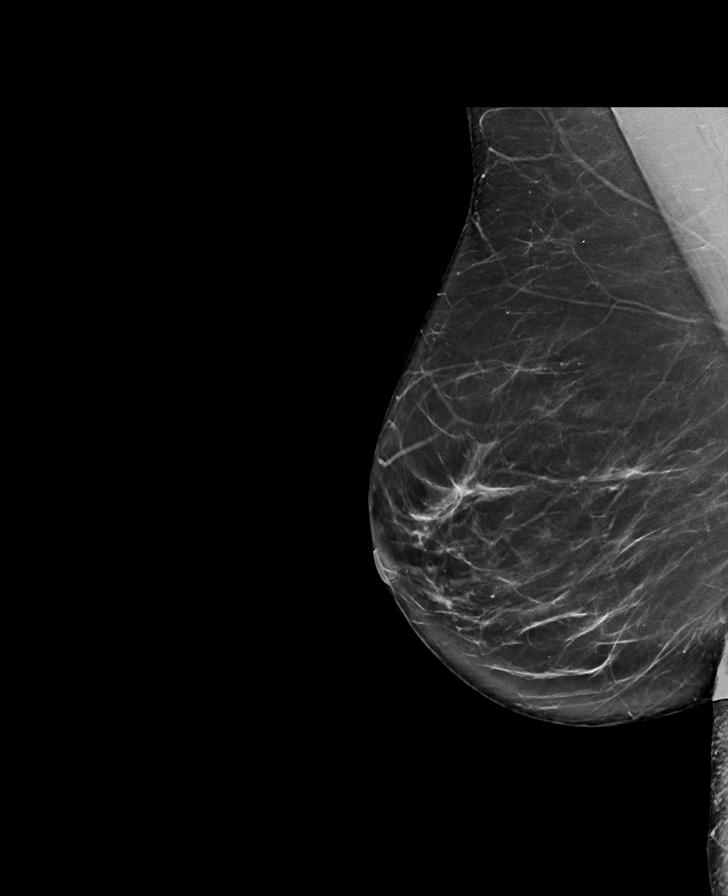

[L MLO synth-2D]
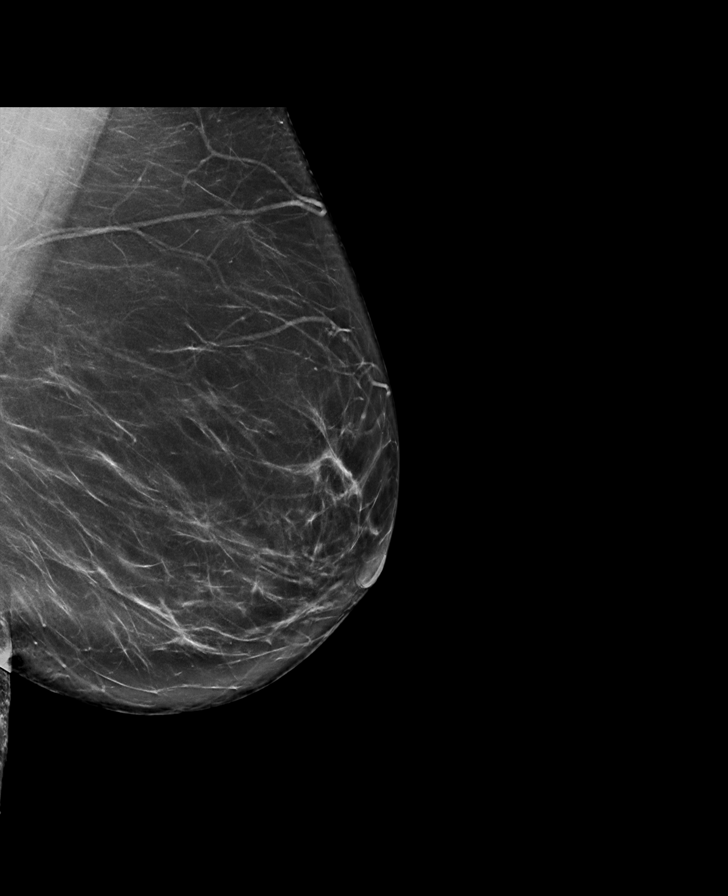

[R CC synth-2D]
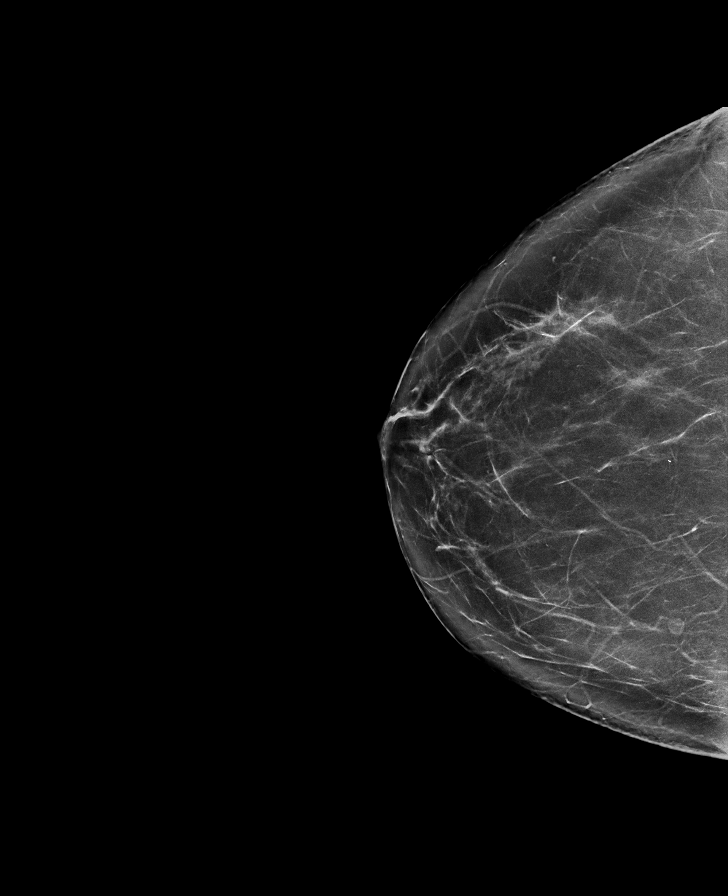

[L CC synth-2D]
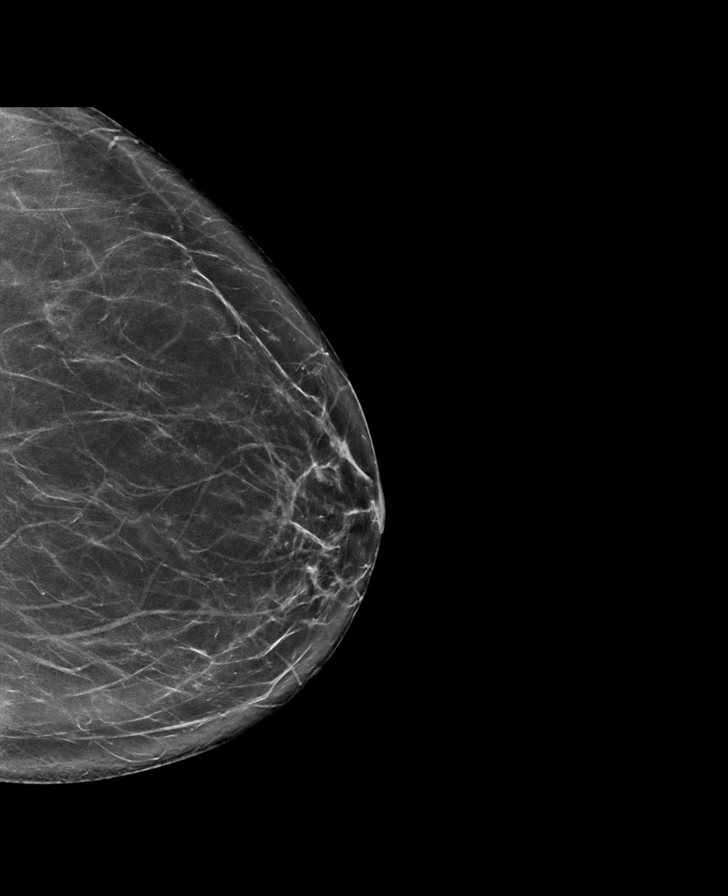

[R CC tomo · tomo slice 40/79.0]
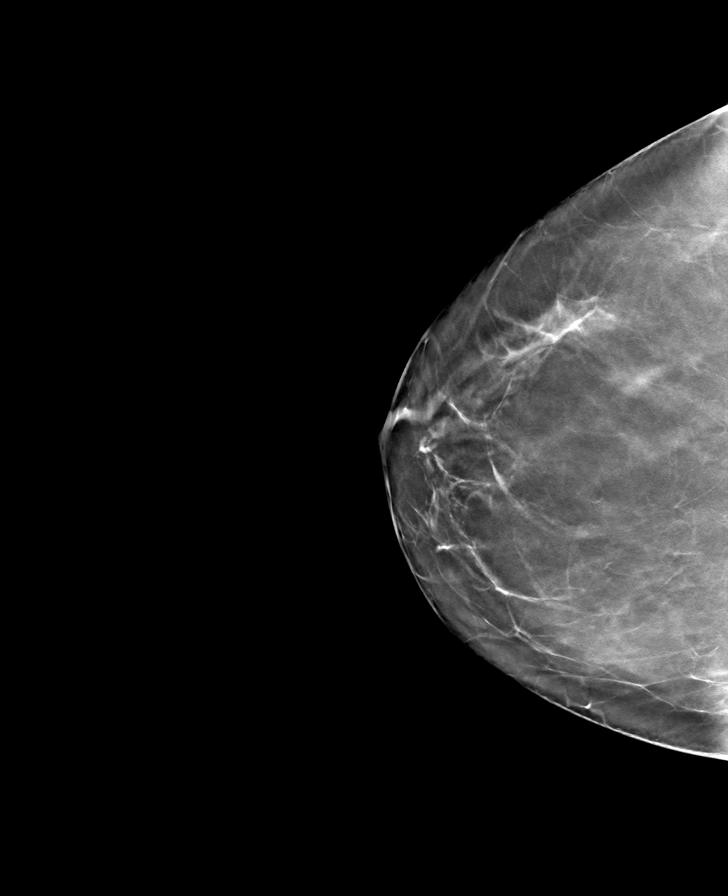

[R MLO tomo · tomo slice 41/82.0]
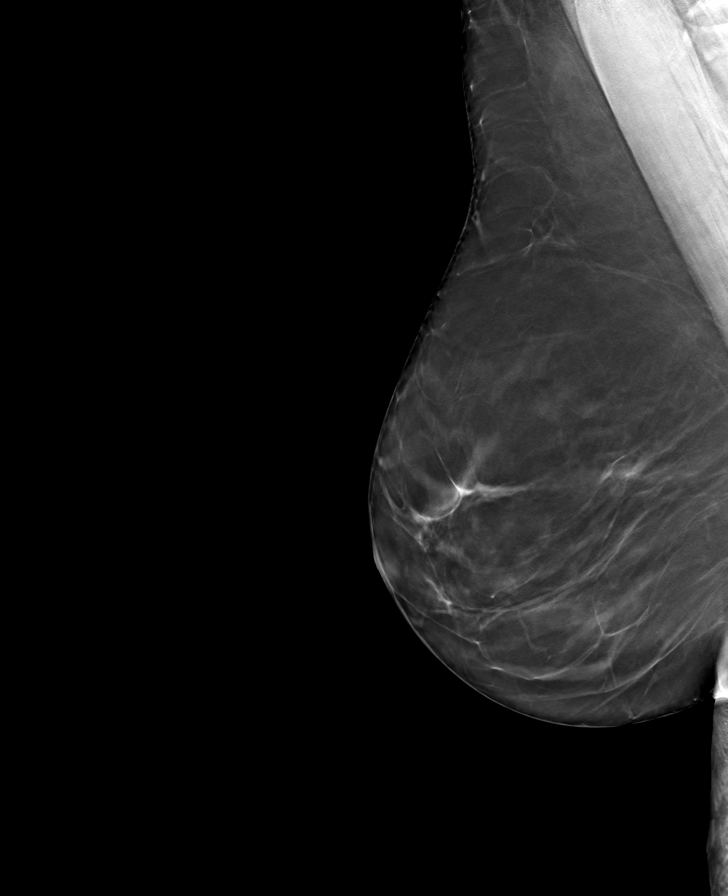

[L CC tomo · tomo slice 39/76.0]
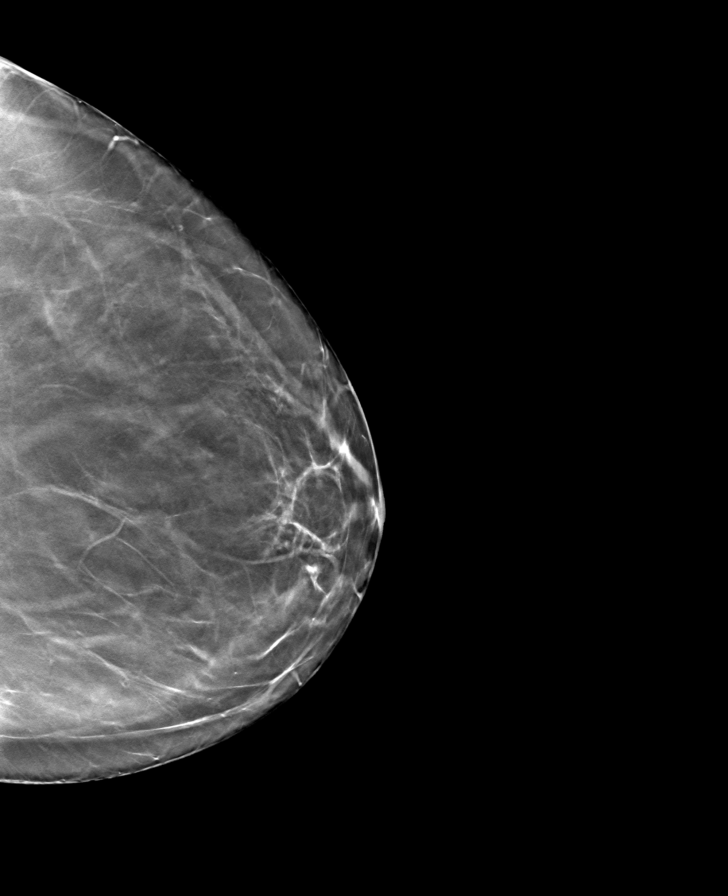

[L MLO tomo · tomo slice 41/82.0]
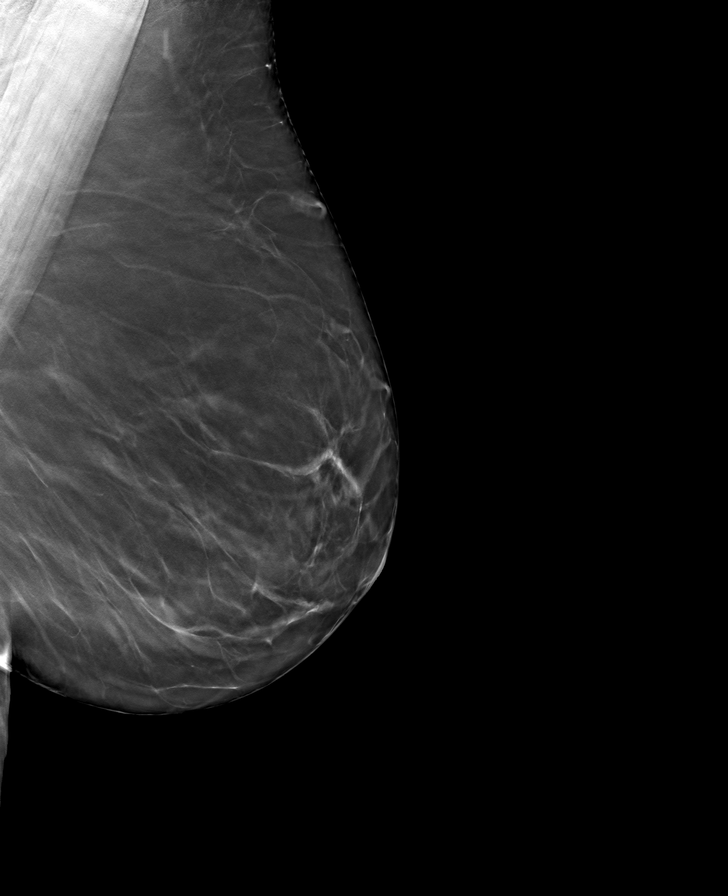

[8 of 24 positions shown; findings below may reference images not displayed]

ACR Breast Density Category b: There are scattered areas of
fibroglandular density.
FINDINGS: There are no findings suspicious for malignancy.
IMPRESSION: No mammographic evidence of malignancy. A result letter of this
screening mammogram will be mailed directly to the patient.

RECOMMENDATION:
Screening mammogram in one year. (Code:51-O-LD2)

BI-RADS CATEGORY  1: Negative.

## 2022-12-31 ENCOUNTER — Other Ambulatory Visit: Payer: Self-pay | Admitting: Neurology

## 2022-12-31 DIAGNOSIS — G43009 Migraine without aura, not intractable, without status migrainosus: Secondary | ICD-10-CM

## 2023-01-12 ENCOUNTER — Encounter: Payer: Self-pay | Admitting: Family Medicine

## 2023-01-12 DIAGNOSIS — E669 Obesity, unspecified: Secondary | ICD-10-CM

## 2023-01-12 MED ORDER — ZEPBOUND 15 MG/0.5ML ~~LOC~~ SOAJ
15.0000 mg | SUBCUTANEOUS | 5 refills | Status: DC
Start: 2023-01-12 — End: 2023-02-18

## 2023-01-15 DIAGNOSIS — R61 Generalized hyperhidrosis: Secondary | ICD-10-CM | POA: Diagnosis not present

## 2023-02-18 ENCOUNTER — Encounter: Payer: Self-pay | Admitting: Family Medicine

## 2023-02-18 ENCOUNTER — Ambulatory Visit (INDEPENDENT_AMBULATORY_CARE_PROVIDER_SITE_OTHER): Payer: BC Managed Care – PPO | Admitting: Family Medicine

## 2023-02-18 VITALS — BP 102/70 | HR 76 | Temp 97.7°F | Ht 64.0 in | Wt 177.2 lb

## 2023-02-18 DIAGNOSIS — R7303 Prediabetes: Secondary | ICD-10-CM

## 2023-02-18 DIAGNOSIS — E669 Obesity, unspecified: Secondary | ICD-10-CM | POA: Diagnosis not present

## 2023-02-18 LAB — POCT GLYCOSYLATED HEMOGLOBIN (HGB A1C): Hemoglobin A1C: 5.4 % (ref 4.0–5.6)

## 2023-02-18 MED ORDER — ZEPBOUND 15 MG/0.5ML ~~LOC~~ SOAJ
15.0000 mg | SUBCUTANEOUS | 1 refills | Status: DC
Start: 2023-02-18 — End: 2023-09-07

## 2023-02-18 NOTE — Progress Notes (Signed)
Established Patient Office Visit  Subjective   Patient ID: Karen Davis, female    DOB: October 08, 1970  Age: 52 y.o. MRN: [id number]  Chief Complaint  Patient presents with   Medical Management of Chronic Issues    Patient is here for follow up on A1C and weight loss.   Obesity -- patient has lost a considerable amount of weight, total of 46 pound of weight loss. She reports some fatigue but her GYN just started on estrogen so she thinks this will help with her fatigue. No constipation, maybe some mild nausea. Heartburn is still occasional, she is taking OTC medications for this. States that she is eating protein and non starchy veggies.     Current Outpatient Medications  Medication Instructions   Erenumab-aooe ([name]) 140 MG/ML SOAJ INJECT 140 MG UNDER THE SKIN EVERY 30 DAYS   estradiol (VIVELLE-DOT) 0.075 MG/24HR 1 patch, 2 times weekly   Ferrous Sulfate (IRON PO) 65 mg, 2 times weekly   flurbiprofen (ANSAID) 100 mg, Oral, Every 8 hours PRN   Zepbound 15 mg, Subcutaneous, Weekly    Patient Active Problem List   Diagnosis Date Noted   Obesity (BMI 35.0-39.9 without comorbidity) 11/11/2021   Prediabetes 11/11/2021   Anemia 09/16/2019   Arthralgia of right elbow 05/19/2018   Pain in finger of left hand 05/05/2017      Review of Systems  All other systems reviewed and are negative.     Objective:     BP 102/70   Pulse 76   Temp 97.7 F (36.5 C) (Oral)   Ht 5\' 4"  (1.626 m)   Wt 177 lb 3.2 oz (80.4 kg)   SpO2 99%   BMI 30.42 kg/m    Physical Exam Vitals reviewed.  Constitutional:      Appearance: Normal appearance. She is well-groomed. She is obese.  Cardiovascular:     Rate and Rhythm: Normal rate and regular rhythm.     Pulses: Normal pulses.     Heart sounds: S1 normal and S2 normal.  Pulmonary:     Effort: Pulmonary effort is normal.     Breath sounds: Normal breath sounds and air entry.  Musculoskeletal:     Right lower leg: No edema.     Left  lower leg: No edema.  Neurological:     Mental Status: She is alert and oriented to person, place, and time. Mental status is at baseline.     Gait: Gait is intact.  Psychiatric:        Mood and Affect: Mood and affect normal.        Speech: Speech normal.        Behavior: Behavior normal.      Results for orders placed or performed in visit on 02/18/23  POC HgB A1c  Result Value Ref Range   Hemoglobin A1C 5.4 4.0 - 5.6 %   HbA1c POC (<> result, manual entry)     HbA1c, POC (prediabetic range)     HbA1c, POC (controlled diabetic range)        The 10-year ASCVD risk score (Arnett DK, et al., 2019) is: 0.8%    Assessment & Plan:  Prediabetes -     POCT glycosylated hemoglobin (Hb A1C)  Obesity (BMI 35.0-39.9 without comorbidity) -     Zepbound; Inject 15 mg into the skin once a week.  Dispense: 6 mL; Refill: 1   A1C is in the normal range at 5.4, pt is doing very well on the Zepbound  therapy, a total of 46 pounds lost since March 2024. Her goal weight is around 125 pounds. She will continue on her low carb diet, confirmed that pt is taking daily MVI to help prevent vitamin Deficiencies. Will see her back in 6 months for her annual physical.   Return in about 6 months (around 08/20/2023) for annual physical exam -- fasting for bloodwork.    Karie Georges, MD

## 2023-02-19 ENCOUNTER — Telehealth: Payer: Self-pay

## 2023-02-19 ENCOUNTER — Other Ambulatory Visit (HOSPITAL_COMMUNITY): Payer: Self-pay

## 2023-02-19 NOTE — Telephone Encounter (Signed)
[facility] and informed [facility] of the approval as below.  Patient informed via Mychart message.

## 2023-02-19 NOTE — Telephone Encounter (Signed)
Pharmacy Patient Advocate Encounter   Received notification from Patient Advice Request messages that prior authorization for Zepbound 15MG /0.5ML pen-injectors is required/requested.   Insurance verification completed.   The patient is insured through [employer] .   Per test claim: PA required and submitted KEY/EOC/Request #: [id number] APPROVED from 01/20/23 to 02/19/24. Ran test claim, Copay is $0. This test claim was processed through [facility]- copay amounts may vary at other pharmacies due to pharmacy/plan contracts, or as the patient moves through the different stages of their insurance plan.   Case ID #: [id number] Key: Shelly Bombard

## 2023-02-19 NOTE — Telephone Encounter (Signed)
See My chart message

## 2023-03-03 DIAGNOSIS — R61 Generalized hyperhidrosis: Secondary | ICD-10-CM | POA: Diagnosis not present

## 2023-03-15 NOTE — Progress Notes (Signed)
 NEUROLOGY FOLLOW UP OFFICE NOTE  Karen Davis [id number]  Assessment/Plan:   Migraine without aura, without status migrainosus, not intractable  Elevated blood pressure.     1.  Migraine prevention:  [name]140mg  Q4w 2.  Migraine rescue:  Flurbiprofen  100mg  3.  Limit use of pain relievers to no more than 2 days out of week to prevent risk of rebound or medication-overuse headache. 4.  Keep headache diary 5.  Follow up with PCP regarding BP 6.  Follow up in 1 year or as needed   Subjective:  Karen Davis is a 53 year old right-handed woman who follows up for migraines.   UPDATE: Intensity:  Moderate-severe Duration:  20 minutes with flurbiprofen  Frequency:  1 migraine a month or less, only 1 severe headache in 6 months.     Current NSAIDS:  flurbiprofen  Current analgesics:  Tylenol Sinus Current triptans:  none Current ergotamine:  None Current anti-emetic:  None Current muscle relaxants:  none Current anti-anxiolytic:  None Current sleep aide:  None Current Antihypertensive medications:  None Current Antidepressant medications:  venlafaxine  XR 37.5mg  QD (trying to taper down to QOD) Current Anticonvulsant medications:  none Current anti-CGRP:  [name]  140mg   Current Vitamins/Herbal/Supplements:  riboflavin, ferrous sulfate, D3, C, tart cherry Current Antihistamines/Decongestants:  None Other therapy:  none Hormone/birth control:  None   Caffeine:  1 cup coffee and maybe 1 soda daily Diet:  hydrates Exercise:  Not routine Depression:  no; Anxiety:  yes.  Mother was in a MVC this past year.  Now living with her. Other pain:  Neck pain Sleep hygiene:  ok   HISTORY: Onset:  2013 or 2014 Location:  Right maxillary/periorbital region, sometimes radiating to occipital region Quality:  Pressure, progressing to stabbing Initial Intensity:  10/10; May:  8/10 Aura:  no Prodrome:  no Associated symptoms:  Nausea, photophobia Initial Duration:  30 minutes with  sumatriptan , otherwise several hours; May:  Usually 1 hour Initial Frequency:  6 days per month Triggers:  Change in barometric pressure, heat Relieving factors:  None Activity:  Needs to lay down   Past NSAIDS:  oxaprozin, ibuprofen, ketoprofen, etodolac, Mobic Past analgesics:  Excedrin, Tylenol Past abortive triptans:  sumatriptan  100mg , rizatriptan  10mg  Past muscle relaxants:  tizanidine  Past anti-emetic:  no Past anti-anxiolytic:  no Past sleep aide:  no  Past antihypertensive medications:  verapamil  HCL ER 120mg  (was effective but she stopped due to feeling depressed) Past antidepressant medications:  Nortriptyline  25mg  Past anticonvulsant medications:  Topiramate  (effective but caused diarrhea) Past anti-CGRP:  [name]  140mg  (effective but no longer covered by insurance); [name]  (ineffective).   Other past therapy:  Reyvow  Past vitamins/Herbal/Supplements:  riboflavin Past antihistamines/decongestants:  no   Family history of headache:  Mother had migraines.  Brother had stroke in his 35s.  Paternal great grandfather had stroke in his 54s.   MRI and MRA of head and MRA of neck from 11/12/13 were personally reviewed and were unremarkable.   She also reports bilateral hand numbness and tingling that can radiate up to above elbows.  They are prominent when in bed and often wakes her up.  She denies neck pain or weakness.  NCV-EMG was normal.  No evidence of carpal tunnel.  She does wear wrist splints at night when it flares up and it helps.   She also has chronic low back pain since slipping and falling on her coccyx around 2015.  She feels right lower back pain radiating down into her buttocks and associated with numbness and  tingling of her anterior thigh.  There is no weakness.  She had PT for low back pain.  Traction helped.  She reported memory problems.  She has previously been diagnosed with ADD and she feels it is worse.  Cannot multitask.  Her husband has to assist her while  she cooks.  No preceding illness.  Has not had COVID as far as she knows.  No new stressors.  No new medications.  Labs from 03/12/2021 revealed TSH of 2.03 but B12 of 245.  She was advised to start B12 1000mcg daily.  Repeat B12 level on 09/09/2021 was 679.  She is no longer on the supplement and has altered her diet.  However she does not note improvement in memory.    PAST MEDICAL HISTORY: Past Medical History:  Diagnosis Date   Alcohol abuse    Anemia    Depression    Migraines    Pre-diabetes     MEDICATIONS: Current Outpatient Medications on File Prior to Visit  Medication Sig Dispense Refill   Erenumab -aooe ([name] ) 140 MG/ML SOAJ INJECT 140 MG UNDER THE SKIN EVERY 30 DAYS 3 mL 2   estradiol  (VIVELLE -DOT) 0.075 MG/24HR Place 1 patch onto the skin 2 (two) times a week.     Ferrous Sulfate (IRON PO) Take 65 mg by mouth 2 (two) times a week.     flurbiprofen  (ANSAID ) 100 MG tablet Take 1 tablet (100 mg total) by mouth every 8 (eight) hours as needed (Maximum 3 tablets in 24 hours). 72 tablet 1   tirzepatide  (ZEPBOUND ) 15 MG/0.5ML Pen Inject 15 mg into the skin once a week. 6 mL 1   No current facility-administered medications on file prior to visit.    ALLERGIES: Allergies  Allergen Reactions   Vantin [Cefpodoxime] Other (See Comments)    migraine    FAMILY HISTORY: Family History  Problem Relation Age of Onset   Melanoma Mother    Liver disease Father    Depression Father    High blood pressure Father    High Cholesterol Father    Alcohol abuse Father    Stroke Brother 43   Cancer Brother        skin   Skin cancer Maternal Grandmother    Leukemia Maternal Grandfather    Arthritis Paternal Grandmother    Hearing loss Paternal Grandmother    Hearing loss Paternal Grandfather    Colon cancer Neg Hx    Colon polyps Neg Hx    Esophageal cancer Neg Hx    Rectal cancer Neg Hx    Stomach cancer Neg Hx       Objective:  Blood pressure (!) 151/83, pulse 91, weight  172 lb (78 kg), SpO2 99%. General: No acute distress.  Patient appears well-groomed.   Head:  Normocephalic/atraumatic Eyes:  Fundi examined but not visualized Neck: supple, no paraspinal tenderness, full range of motion Heart:  Regular rate and rhythm Neurological Exam: alert and oriented.  Speech fluent and not dysarthric, language intact.  CN II-XII intact. Bulk and tone normal, muscle strength 5/5 throughout.  Sensation to light touch intact.  Deep tendon reflexes 2+ throughout.  Finger to nose testing intact.  Gait normal, Romberg negative.   Juliene Dunnings, DO  CC:  Heron Sharper, MD

## 2023-03-16 ENCOUNTER — Ambulatory Visit (INDEPENDENT_AMBULATORY_CARE_PROVIDER_SITE_OTHER): Payer: BC Managed Care – PPO | Admitting: Neurology

## 2023-03-16 ENCOUNTER — Encounter: Payer: Self-pay | Admitting: Neurology

## 2023-03-16 DIAGNOSIS — G43009 Migraine without aura, not intractable, without status migrainosus: Secondary | ICD-10-CM

## 2023-03-16 MED ORDER — AIMOVIG 140 MG/ML ~~LOC~~ SOAJ: SUBCUTANEOUS | 3 refills | Status: DC

## 2023-03-16 MED ORDER — FLURBIPROFEN 100 MG PO TABS: 100.0000 mg | ORAL_TABLET | Freq: Three times a day (TID) | ORAL | 3 refills | Status: DC | PRN

## 2023-03-16 NOTE — Patient Instructions (Signed)
 [name] Flurbiprofen

## 2023-04-02 DIAGNOSIS — D485 Neoplasm of uncertain behavior of skin: Secondary | ICD-10-CM | POA: Diagnosis not present

## 2023-04-02 DIAGNOSIS — L400 Psoriasis vulgaris: Secondary | ICD-10-CM | POA: Diagnosis not present

## 2023-04-02 DIAGNOSIS — D2262 Melanocytic nevi of left upper limb, including shoulder: Secondary | ICD-10-CM | POA: Diagnosis not present

## 2023-04-02 DIAGNOSIS — L821 Other seborrheic keratosis: Secondary | ICD-10-CM | POA: Diagnosis not present

## 2023-04-02 DIAGNOSIS — D225 Melanocytic nevi of trunk: Secondary | ICD-10-CM | POA: Diagnosis not present

## 2023-04-02 DIAGNOSIS — D2271 Melanocytic nevi of right lower limb, including hip: Secondary | ICD-10-CM | POA: Diagnosis not present

## 2023-04-02 DIAGNOSIS — L905 Scar conditions and fibrosis of skin: Secondary | ICD-10-CM | POA: Diagnosis not present

## 2023-06-16 ENCOUNTER — Telehealth: Payer: Self-pay

## 2023-06-16 ENCOUNTER — Telehealth: Payer: Self-pay | Admitting: Pharmacy Technician

## 2023-06-16 ENCOUNTER — Other Ambulatory Visit (HOSPITAL_COMMUNITY): Payer: Self-pay

## 2023-06-16 NOTE — Telephone Encounter (Signed)
 Pharmacy Patient Advocate Encounter   Received notification from Pt Calls Messages that prior authorization for [name] 140MG  is required/requested.   Insurance verification completed.   The patient is insured through [employer] .   Per test claim: PA required; PA submitted to above mentioned insurance via CoverMyMeds Key/confirmation #/EOC [id number] Status is pending

## 2023-06-16 NOTE — Telephone Encounter (Signed)
 PA needed for [name].

## 2023-06-16 NOTE — Telephone Encounter (Signed)
 PA has been submitted, and telephone encounter has been created. Please see telephone encounter dated 4.15.25.

## 2023-06-17 ENCOUNTER — Other Ambulatory Visit (HOSPITAL_COMMUNITY): Payer: Self-pay

## 2023-06-17 NOTE — Telephone Encounter (Signed)
 Pharmacy Patient Advocate Encounter  Received notification from [facility] that Prior Authorization for [name] 140MG  has been APPROVED from 3.16.25 to 4.15.26. Unable to obtain price due to refill too soon rejection, last fill date 4.13.25 next available fill date6.19.25   PA #/Case ID/Reference #: [id number]

## 2023-07-09 DIAGNOSIS — Z79890 Hormone replacement therapy (postmenopausal): Secondary | ICD-10-CM | POA: Diagnosis not present

## 2023-07-09 DIAGNOSIS — F520 Hypoactive sexual desire disorder: Secondary | ICD-10-CM | POA: Diagnosis not present

## 2023-07-09 DIAGNOSIS — N760 Acute vaginitis: Secondary | ICD-10-CM | POA: Diagnosis not present

## 2023-09-07 ENCOUNTER — Encounter: Payer: Self-pay | Admitting: Family Medicine

## 2023-09-07 ENCOUNTER — Ambulatory Visit (INDEPENDENT_AMBULATORY_CARE_PROVIDER_SITE_OTHER): Payer: BC Managed Care – PPO | Admitting: Family Medicine

## 2023-09-07 VITALS — BP 120/88 | HR 72 | Temp 97.8°F | Ht 63.5 in | Wt 138.8 lb

## 2023-09-07 DIAGNOSIS — R5383 Other fatigue: Secondary | ICD-10-CM

## 2023-09-07 DIAGNOSIS — E669 Obesity, unspecified: Secondary | ICD-10-CM

## 2023-09-07 DIAGNOSIS — R7303 Prediabetes: Secondary | ICD-10-CM | POA: Diagnosis not present

## 2023-09-07 DIAGNOSIS — Z13220 Encounter for screening for lipoid disorders: Secondary | ICD-10-CM

## 2023-09-07 DIAGNOSIS — Z0000 Encounter for general adult medical examination without abnormal findings: Secondary | ICD-10-CM

## 2023-09-07 DIAGNOSIS — Z0001 Encounter for general adult medical examination with abnormal findings: Secondary | ICD-10-CM | POA: Diagnosis not present

## 2023-09-07 DIAGNOSIS — Z6824 Body mass index (BMI) 24.0-24.9, adult: Secondary | ICD-10-CM

## 2023-09-07 LAB — COMPREHENSIVE METABOLIC PANEL WITH GFR
ALT: 20 U/L (ref 0–35)
AST: 16 U/L (ref 0–37)
Albumin: 4.5 g/dL (ref 3.5–5.2)
Alkaline Phosphatase: 62 U/L (ref 39–117)
BUN: 16 mg/dL (ref 6–23)
CO2: 27 meq/L (ref 19–32)
Calcium: 9.5 mg/dL (ref 8.4–10.5)
Chloride: 102 meq/L (ref 96–112)
Creatinine, Ser: 0.67 mg/dL (ref 0.40–1.20)
GFR: 100.13 mL/min (ref >60.00)
Glucose, Bld: 88 mg/dL (ref 70–99)
Potassium: 4.5 meq/L (ref 3.5–5.1)
Sodium: 137 meq/L (ref 135–145)
Total Bilirubin: 0.5 mg/dL (ref 0.2–1.2)
Total Protein: 7.6 g/dL (ref 6.0–8.3)

## 2023-09-07 LAB — CBC WITH DIFFERENTIAL/PLATELET
Basophils Absolute: 0 K/uL (ref 0.0–0.1)
Basophils Relative: 0.4 % (ref 0.0–3.0)
Eosinophils Absolute: 0.1 K/uL (ref 0.0–0.7)
Eosinophils Relative: 1.1 % (ref 0.0–5.0)
HCT: 40.7 % (ref 36.0–46.0)
Hemoglobin: 13.6 g/dL (ref 12.0–15.0)
Lymphocytes Relative: 40.4 % (ref 12.0–46.0)
Lymphs Abs: 2.6 K/uL (ref 0.7–4.0)
MCHC: 33.5 g/dL (ref 30.0–36.0)
MCV: 84.3 fl (ref 78.0–100.0)
Monocytes Absolute: 0.3 K/uL (ref 0.1–1.0)
Monocytes Relative: 4.3 % (ref 3.0–12.0)
Neutro Abs: 3.5 K/uL (ref 1.4–7.7)
Neutrophils Relative %: 53.8 % (ref 43.0–77.0)
Platelets: 368 K/uL (ref 150.0–400.0)
RBC: 4.83 Mil/uL (ref 3.87–5.11)
RDW: 15.5 % (ref 11.5–15.5)
WBC: 6.5 K/uL (ref 4.0–10.5)

## 2023-09-07 LAB — LIPID PANEL
Cholesterol: 204 mg/dL — ABNORMAL HIGH (ref 0–200)
HDL: 55.1 mg/dL (ref >39.00)
LDL Cholesterol: 131 mg/dL — ABNORMAL HIGH (ref 0–99)
NonHDL: 148.47
Total CHOL/HDL Ratio: 4
Triglycerides: 88 mg/dL (ref 0.0–149.0)
VLDL: 17.6 mg/dL (ref 0.0–40.0)

## 2023-09-07 LAB — VITAMIN B12: Vitamin B-12: 576 pg/mL (ref 211–911)

## 2023-09-07 LAB — TSH: TSH: 1.55 u[IU]/mL (ref 0.35–5.50)

## 2023-09-07 LAB — POCT GLYCOSYLATED HEMOGLOBIN (HGB A1C): Hemoglobin A1C: 5.3 % (ref 4.0–5.6)

## 2023-09-07 LAB — VITAMIN D 25 HYDROXY (VIT D DEFICIENCY, FRACTURES): VITD: 34.76 ng/mL (ref 30.00–100.00)

## 2023-09-07 MED ORDER — ZEPBOUND 15 MG/0.5ML ~~LOC~~ SOAJ: 15.0000 mg | SUBCUTANEOUS | 1 refills | Status: AC

## 2023-09-07 NOTE — Patient Instructions (Signed)

## 2023-09-07 NOTE — Progress Notes (Signed)
 Complete physical exam  Patient: Karen Davis   DOB: [birth year]   53 y.o. Female  MRN: [id number]  Subjective:    Chief Complaint  Patient presents with   Annual Exam    Karen Davis is a 53 y.o. female who presents today for a complete physical exam. She reports consuming a general diet. Home exercise routine includes walking 1-2 hrs per week. She generally feels see below. She reports sleeping poorly, states she has a lot of stress with her mother's illness, states she has pain in her shoulders are painful at night and she occasionally will have hot flashes at night. She does have additional problems to discuss today.   Pt is reporting that lately she is having more dizziness, palpitations and some atypical chest pain. States that it has been happening daily. States that she is getting winded with mild activity, states she feels drained, less energy. Is concerned because her brother had a stroke in his 29's and was concerned about it. 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 States that now however it has become a daily thing.   Most recent fall risk assessment:    03/16/2023    8:36 AM  Fall Risk   Falls in the past year? 0  Number falls in past yr: 0  Injury with Fall? 0  Follow up Falls evaluation completed     Most recent depression screenings:    09/07/2023    7:58 AM 05/12/2022   10:53 AM  PHQ 2/9 Scores  PHQ - 2 Score 0 0  PHQ- 9 Score 6 5    Vision:Within last year and Dental: No current dental problems and Receives regular dental care  Patient Active Problem List   Diagnosis Date Noted   Obesity (BMI 35.0-39.9 without comorbidity) 11/11/2021   Prediabetes 11/11/2021   Anemia 09/16/2019   Arthralgia of right elbow 05/19/2018   Pain in finger of left hand 05/05/2017      Patient Care Team: Ozell Heron HERO, MD as PCP - General  (Family Medicine) Dermatology, [place], [place], [place] (Optometry) Skeet Juliene SAUNDERS, DO as Consulting Physician (Neurology) Ishmael Slough, MD as Consulting Physician (Rheumatology) Marget Lenis, MD as Consulting Physician (Obstetrics and Gynecology)   Outpatient Medications Prior to Visit  Medication Sig   ELESTRIN  0.52 MG/0.87 GM (0.06%) GEL Apply 1 Application topically daily.   Erenumab -aooe ([name] ) 140 MG/ML SOAJ INJECT 140 MG UNDER THE SKIN EVERY 30 DAYS   Ferrous Sulfate (IRON PO) Take 65 mg by mouth 2 (two) times a week.   flurbiprofen  (ANSAID ) 100 MG tablet Take 1 tablet (100 mg total) by mouth every 8 (eight) hours as needed (Maximum 3 tablets in 24 hours).   Multiple Vitamin (MULTIVITAMIN PO) Take 2 tablets by mouth daily.   [DISCONTINUED] tirzepatide  (ZEPBOUND ) 15 MG/0.5ML Pen Inject 15 mg into the skin once a week.   [DISCONTINUED] estradiol  (VIVELLE -DOT) 0.075 MG/24HR Place 1 patch onto the skin 2 (two) times a week.   No facility-administered medications prior to visit.    Review of Systems  HENT:  Negative for hearing loss.   Eyes:  Negative for blurred vision.  Respiratory:  Negative for shortness of breath.   Cardiovascular:  Negative for chest pain.  Gastrointestinal: Negative.   Genitourinary: Negative.   Musculoskeletal:  Negative for back pain.  Neurological:  Negative for headaches.  Psychiatric/Behavioral:  Negative for depression.   All other systems reviewed and are negative.      Objective:     BP 120/88   Pulse 72   Temp 97.8 F (36.6 C) (Oral)   Ht 5' 3.5 (1.613 m)   Wt 138 lb 12.8 oz (63 kg)   SpO2 97%   BMI 24.20 kg/m    Physical Exam Vitals reviewed.  Constitutional:      Appearance: Normal appearance. She is well-groomed and normal weight.  HENT:     Right Ear: Tympanic membrane and ear canal normal.     Left Ear: Tympanic membrane and ear canal normal.     Mouth/Throat:     Mouth: Mucous membranes are moist.      Pharynx: No posterior oropharyngeal erythema.  Eyes:     Conjunctiva/sclera: Conjunctivae normal.  Neck:     Thyroid : No thyromegaly.  Cardiovascular:     Rate and Rhythm: Normal rate and regular rhythm.     Pulses: Normal pulses.     Heart sounds: S1 normal and S2 normal.  Pulmonary:     Effort: Pulmonary effort is normal.     Breath sounds: Normal breath sounds and air entry.  Abdominal:     General: Abdomen is flat. Bowel sounds are normal.     Palpations: Abdomen is soft.  Musculoskeletal:     Right lower leg: No edema.     Left lower leg: No edema.  Lymphadenopathy:     Cervical: No cervical adenopathy.  Neurological:     Mental Status: She is alert and oriented to person, place, and time. Mental status is at baseline.     Gait: Gait is intact.  Psychiatric:        Mood and Affect: Mood and affect normal.        Speech: Speech normal.        Behavior: Behavior normal.        Judgment: Judgment normal.      Results for orders placed or performed in visit on 09/07/23  POC HgB A1c  Result Value Ref Range   Hemoglobin A1C 5.3 4.0 - 5.6 %   HbA1c POC (<> result, manual entry)     HbA1c, POC (prediabetic range)     HbA1c, POC (controlled diabetic range)         Assessment & Plan:    Routine Health Maintenance and Physical Exam  Immunization History  Administered Date(s) Administered   Influenza Inj Mdck Quad Pf 12/06/2016   Influenza, Quadrivalent, Recombinant, Inj, Pf 11/22/2018   Influenza,inj,Quad PF,6+ Mos 12/23/2017   Influenza-Unspecified 12/02/2019, 12/16/2020, 12/01/2022   Moderna Sars-Covid-2 Vaccination 06/11/2019, 07/09/2019, 02/08/2020   Tdap 10/19/2018   Zoster Recombinant(Shingrix) 12/16/2020, 03/18/2021    Health Maintenance  Topic Date Due   Hepatitis B Vaccines (1 of 3 - 19+ 3-dose series) Never done   COVID-19 Vaccine (4 - 2024-25 season) 11/02/2022   Hepatitis C Screening  02/18/2024 (Originally 09/28/1988)   HIV Screening  02/18/2024  (Originally 09/28/1985)   INFLUENZA VACCINE  10/02/2023   MAMMOGRAM  10/23/2024   Cervical Cancer Screening (HPV/Pap Cotest)  09/05/2025   DTaP/Tdap/Td (2 - Td or Tdap) 10/18/2028   Colonoscopy  10/25/2030   Zoster Vaccines- Shingrix  Completed   HPV VACCINES  Aged Out   Meningococcal B Vaccine  Aged Out   The 10-year ASCVD risk score (Arnett DK, et al., 2019) is: 1.1%   Values used to calculate the  score:     Age: 35 years     Clincally relevant sex: Female     Is Non-Hispanic African American: No     Diabetic: No     Tobacco smoker: No     Systolic Blood Pressure: 120 mmHg     Is BP treated: No     HDL Cholesterol: 53.5 mg/dL     Total Cholesterol: 167 mg/dL   Discussed health benefits of physical activity, and encouraged her to engage in regular exercise appropriate for her age and condition.  Prediabetes -     POCT glycosylated hemoglobin (Hb A1C) -     Collection capillary blood specimen -     Comprehensive metabolic panel with GFR; Future  Obesity (BMI 35.0-39.9 without comorbidity) -     Zepbound ; Inject 15 mg into the skin once a week.  Dispense: 6 mL; Refill: 1  Lipid screening -     Lipid panel; Future  Routine general medical examination at a health care facility -     CBC with Differential/Platelet; Future -     Comprehensive metabolic panel with GFR; Future  Other fatigue -     Vitamin B12; Future -     VITAMIN D  25 Hydroxy (Vit-D Deficiency, Fractures); Future -     TSH; Future  Normal physical exam findings. I counseled the patient on the recommended amount of exercise per CDC recommendation. I reviewed preventative screening, immunizations, and medical history and updated in the chart, and appropriate labs and vaccinations were ordered. Handouts given on healthy eating and exercise.    Her symptoms could be related to the zepbound . I counseled the patient to increase hydration/ sodium intake, and also to start strength training exercises, increase protein  intake. Will check vitamin levels and if labs are normal then will refer to cardiology.   Return in about 6 months (around 03/09/2024) for weight loss.     Heron CHRISTELLA Sharper, MD

## 2023-09-09 ENCOUNTER — Ambulatory Visit: Payer: Self-pay | Admitting: Family Medicine

## 2023-10-20 DIAGNOSIS — H43813 Vitreous degeneration, bilateral: Secondary | ICD-10-CM | POA: Diagnosis not present

## 2023-10-20 DIAGNOSIS — H40013 Open angle with borderline findings, low risk, bilateral: Secondary | ICD-10-CM | POA: Diagnosis not present

## 2023-10-26 DIAGNOSIS — Z6823 Body mass index (BMI) 23.0-23.9, adult: Secondary | ICD-10-CM | POA: Diagnosis not present

## 2023-10-26 DIAGNOSIS — Z1231 Encounter for screening mammogram for malignant neoplasm of breast: Secondary | ICD-10-CM | POA: Diagnosis not present

## 2023-10-26 DIAGNOSIS — Z1151 Encounter for screening for human papillomavirus (HPV): Secondary | ICD-10-CM | POA: Diagnosis not present

## 2023-10-26 DIAGNOSIS — Z1272 Encounter for screening for malignant neoplasm of vagina: Secondary | ICD-10-CM | POA: Diagnosis not present

## 2023-10-26 DIAGNOSIS — Z01419 Encounter for gynecological examination (general) (routine) without abnormal findings: Secondary | ICD-10-CM | POA: Diagnosis not present

## 2024-01-27 ENCOUNTER — Ambulatory Visit: Payer: Self-pay

## 2024-01-27 DIAGNOSIS — B9689 Other specified bacterial agents as the cause of diseases classified elsewhere: Secondary | ICD-10-CM | POA: Diagnosis not present

## 2024-01-27 DIAGNOSIS — J028 Acute pharyngitis due to other specified organisms: Secondary | ICD-10-CM | POA: Diagnosis not present

## 2024-01-27 NOTE — Telephone Encounter (Signed)
 FYI Only or Action Required?: FYI only for provider: advised UC d/t scheduling and husbands positive strep.  Patient was last seen in primary care on 09/07/2023 by Ozell Heron HERO, MD.  Called Nurse Triage reporting Sore Throat.  Symptoms began several days ago.  Interventions attempted: OTC medications: mucinex.  Symptoms are: unchanged.  Triage Disposition: Call PCP Within 24 Hours  Patient/caregiver understands and will follow disposition?: Yes    Copied from CRM #[id number]. Topic: Clinical - Red Word Triage >> Jan 27, 2024 11:37 AM Eva FALCON wrote: Red Word that prompted transfer to Nurse Triage: Scratchy throat, sore throat, neck swollen, headaches, stuffiness, loss of voice. Reason for Disposition  [1] Strep throat EXPOSURE within past 10 days AND [2] sore throat  Answer Assessment - Initial Assessment Questions Pt states husband just walked out off his appt with Dr. Ozell and he was diagnosed with strep throat. She states she has the exact same symptoms he does, he just has had his about a week longer. She states Friday she started to get a scratchy, sore throat that felt like fire. She states she could only have liquids that day due to the pain. She states she lost her voice on Sunday. She does have headache, stuffiness, cold symptoms. She states she does  have mild chest pain or shortness of breath only while coughing and not every time she coughs. She has tried mucinex. Due to husband's diagnoses and schedule availability, RN recommended UC for pt to be evaluated today.     1. STREP EXPOSURE: Was the exposure to someone who lives within your home? If not, ask: How much contact did you have with the sick person?      Yes husband 2. ONSET: How many days ago did the contact occur?      ongoing 3. PROVEN STREP: Are you sure the person with strep had a positive throat culture or rapid strep test?      Yes just left dr 4. STREP SYMPTOMS: Do YOU have a sore throat,  fever, or other symptoms suggestive of strep?      Sore throat, no fever 5. VIRAL SYMPTOMS: Are there any symptoms of a cold, such as a runny nose, cough, hoarse voice?     Yes, all of these  Protocols used: Strep Throat Exposure-A-AH

## 2024-01-31 NOTE — Telephone Encounter (Signed)
 Noted- ok to close.

## 2024-02-09 DIAGNOSIS — Z13820 Encounter for screening for osteoporosis: Secondary | ICD-10-CM | POA: Diagnosis not present

## 2024-03-09 ENCOUNTER — Ambulatory Visit (INDEPENDENT_AMBULATORY_CARE_PROVIDER_SITE_OTHER): Admitting: Family Medicine

## 2024-03-09 ENCOUNTER — Encounter: Payer: Self-pay | Admitting: Family Medicine

## 2024-03-09 VITALS — BP 122/80 | HR 70 | Temp 97.6°F | Ht 63.5 in | Wt 120.1 lb

## 2024-03-09 DIAGNOSIS — E669 Obesity, unspecified: Secondary | ICD-10-CM

## 2024-03-09 DIAGNOSIS — R7303 Prediabetes: Secondary | ICD-10-CM

## 2024-03-09 LAB — POCT GLYCOSYLATED HEMOGLOBIN (HGB A1C): Hemoglobin A1C: 5.4 % (ref 4.0–5.6)

## 2024-03-09 NOTE — Patient Instructions (Signed)
 [email or web address]  [email or web address]

## 2024-03-09 NOTE — Progress Notes (Signed)
 "  Established Patient Office Visit  Subjective   Patient ID: Karen Davis, female    DOB: 20-Jul-1970  Age: 54 y.o. MRN: [id number]  Chief Complaint  Patient presents with   Medical Management of Chronic Issues    HPI Discussed the use of AI scribe software for clinical note transcription with the patient, who gave verbal consent to proceed.  History of Present Illness   Karen Davis is a 54 year old female who presents for a follow-up visit regarding weight management and prediabetes.  She has lost significant weight from 223 lb to 120 lb over the past year, with BMI decreasing from 38 to 20. Her weight is generally stable, though she has lost an additional 18 lb since July.  Prior dizziness, palpitations, and chest pain resolved after she increased fluid and electrolyte intake. She denies current dizziness, palpitations, or chest pain.  She uses Zepbound  injections for weight management and has been on this medication for over a year. She has four doses remaining and is spacing them out due to cost concerns.       Current Outpatient Medications  Medication Instructions   ELESTRIN  0.52 MG/0.87 GM (0.06%) GEL 1 Application, Daily   Erenumab -aooe ([name] ) 140 MG/ML SOAJ INJECT 140 MG UNDER THE SKIN EVERY 30 DAYS   Ferrous Sulfate (IRON PO) 65 mg, 2 times weekly   flurbiprofen  (ANSAID ) 100 mg, Oral, Every 8 hours PRN   Multiple Vitamin (MULTIVITAMIN PO) 2 tablets, Daily   OVER THE COUNTER MEDICATION Supplement for bone density-once a day   Zepbound  15 mg, Subcutaneous, Weekly    Patient Active Problem List   Diagnosis Date Noted   Obesity (BMI 35.0-39.9 without comorbidity) 11/11/2021   Prediabetes 11/11/2021   Anemia 09/16/2019   Arthralgia of right elbow 05/19/2018   Pain in finger of left hand 05/05/2017     Review of Systems  All other systems reviewed and are negative.     Objective:     BP 122/80   Pulse 70   Temp 97.6 F (36.4 C) (Oral)   Ht 5'  3.5 (1.613 m)   Wt 120 lb 1.6 oz (54.5 kg)   SpO2 99%   BMI 20.94 kg/m    Physical Exam Vitals reviewed.  Constitutional:      Appearance: Normal appearance. She is well-groomed and normal weight.  Eyes:     Conjunctiva/sclera: Conjunctivae normal.  Cardiovascular:     Rate and Rhythm: Normal rate and regular rhythm.     Heart sounds: S1 normal and S2 normal.  Pulmonary:     Effort: Pulmonary effort is normal.     Breath sounds: Normal breath sounds and air entry.  Musculoskeletal:     Right lower leg: No edema.     Left lower leg: No edema.  Neurological:     Mental Status: She is alert and oriented to person, place, and time. Mental status is at baseline.     Gait: Gait is intact.  Psychiatric:        Mood and Affect: Mood and affect normal.        Speech: Speech normal.        Behavior: Behavior normal.        Judgment: Judgment normal.      Results for orders placed or performed in visit on 03/09/24  POC HgB A1c  Result Value Ref Range   Hemoglobin A1C 5.4 4.0 - 5.6 %   HbA1c POC (<> result, manual entry)  HbA1c, POC (prediabetic range)     HbA1c, POC (controlled diabetic range)        The 10-year ASCVD risk score (Arnett DK, et al., 2019) is: 1.5%    Assessment & Plan:  Prediabetes -     POCT glycosylated hemoglobin (Hb A1C) -     Collection capillary blood specimen  Obesity (BMI 35.0-39.9 without comorbidity)   Assessment and Plan    Weight management following obesity Significant weight loss achieved with BMI reduced to 20. Insurance no longer covers Zepbound  due to BMI criteria. Discussed options for continuing weight management medication, including transitioning to oral semaglutide [facility]) or using online compounding pharmacies for tirzepatide . Oral semaglutide is more affordable but may not be as effective as injections. Discussed potential side effects and the need for dose adjustment. Shared decision-making with her and her husband regarding  cost-effective options. - Consider transitioning to oral semaglutide [facility]) at $199 per month for maintenance. - Explore online compounding pharmacies for tirzepatide  options. - Send message via MyChart with decision on medication option.  Prediabetes A1c improved from 6.1 to 5.4, indicating resolution of prediabetes. Weight loss and lifestyle changes have contributed to improved glycemic control.  General health maintenance Up to date on health maintenance. No additional labs needed until annual visit. - Will schedule annual visit after July 7th for routine labs and health maintenance.        Return in about 26 weeks (around 09/07/2024) for annual physical exam.    Heron CHRISTELLA Sharper, MD "

## 2024-03-10 ENCOUNTER — Other Ambulatory Visit: Payer: Self-pay | Admitting: Neurology

## 2024-03-10 DIAGNOSIS — G43009 Migraine without aura, not intractable, without status migrainosus: Secondary | ICD-10-CM

## 2024-03-14 NOTE — Progress Notes (Unsigned)
 "  NEUROLOGY FOLLOW UP OFFICE NOTE  Karen Davis [id number]  Assessment/Plan:   Migraine without aura, without status migrainosus, not intractable     Migraine prevention:  [name]  140mg   Migraine rescue:  flurbiprofen  100mg   Lifestyle modification: Limit use of pain relievers to no more than 9 days out of the month to prevent risk of rebound or medication-overuse headache. Diet modification/hydration/caffeine cessation Routine exercise Sleep hygiene Consider vitamins/supplements:  magnesium citrate 400mg  daily, riboflavin 400mg  daily, CoQ10 100mg  three times daily Keep headache diary Follow up 1 year  Subjective:  Karen Davis is a 54 year old right-handed woman who follows up for migraines.   UPDATE: Overall stable Intensity:  Moderate-severe Duration:  20 minutes with flurbiprofen  (sometimes takes Sudafed as well) Frequency:  2 a month (depending on weather)    Current NSAIDS:  flurbiprofen  Current analgesics:  Tylenol Sinus Current triptans:  none Current ergotamine:  None Current anti-emetic:  None Current muscle relaxants:  none Current anti-anxiolytic:  None Current sleep aide:  None Current Antihypertensive medications:  None Current Antidepressant medications:  venlafaxine  XR 37.5mg  QD (trying to taper down to QOD) Current Anticonvulsant medications:  none Current anti-CGRP:  [name]  140mg   Current Vitamins/Herbal/Supplements:  riboflavin, ferrous sulfate, D3, C, tart cherry Current Antihistamines/Decongestants:  Sudafed PRN Other therapy:  none Hormone/birth control:  None   Caffeine:  1 cup coffee and maybe 1 soda daily Diet:  hydrates Exercise:  Not routine Depression:  no; Anxiety:  yes.  Mother was in a MVC this past year.  Now living with her. Other pain:  Neck pain Sleep hygiene:  ok   HISTORY: Onset:  2013 or 2014 Location:  Right maxillary/periorbital region, sometimes radiating to occipital region Quality:  Pressure, progressing to  stabbing Initial Intensity:  10/10; May:  8/10 Aura:  no Prodrome:  no Associated symptoms:  Nausea, photophobia Initial Duration:  30 minutes with sumatriptan , otherwise several hours; May:  Usually 1 hour Initial Frequency:  6 days per month Triggers:  Change in barometric pressure, dry heat Relieving factors:  None Activity:  Needs to lay down   Past NSAIDS:  oxaprozin, ibuprofen, ketoprofen, etodolac, Mobic Past analgesics:  Excedrin, Tylenol Past abortive triptans:  sumatriptan  100mg , rizatriptan  10mg  Past muscle relaxants:  tizanidine  Past anti-emetic:  no Past anti-anxiolytic:  no Past sleep aide:  no  Past antihypertensive medications:  verapamil  HCL ER 120mg  (was effective but she stopped due to feeling depressed) Past antidepressant medications:  Nortriptyline  25mg  Past anticonvulsant medications:  Topiramate  (effective but caused diarrhea) Past anti-CGRP:  [name]  140mg  (effective but no longer covered by insurance); [name]  (ineffective).   Other past therapy:  Reyvow  Past vitamins/Herbal/Supplements:  riboflavin Past antihistamines/decongestants:  no   Family history of headache:  Mother had migraines.  Brother had stroke in his 25s.  Paternal great grandfather had stroke in his 69s.   MRI and MRA of head and MRA of neck from 11/12/13 were personally reviewed and were unremarkable.   She also reports bilateral hand numbness and tingling that can radiate up to above elbows.  They are prominent when in bed and often wakes her up.  She denies neck pain or weakness.  NCV-EMG was normal.  No evidence of carpal tunnel.  She does wear wrist splints at night when it flares up and it helps.   She also has chronic low back pain since slipping and falling on her coccyx around 2015.  She feels right lower back pain radiating down into her buttocks and associated with  numbness and tingling of her anterior thigh.  There is no weakness.  She had PT for low back pain.  Traction  helped.  She reported memory problems.  She has previously been diagnosed with ADD and she feels it is worse.  Cannot multitask.  Her husband has to assist her while she cooks.  No preceding illness.  Has not had COVID as far as she knows.  No new stressors.  No new medications.  Labs from 03/12/2021 revealed TSH of 2.03 but B12 of 245.  She was advised to start B12 1000mcg daily.  Repeat B12 level on 09/09/2021 was 679.  She is no longer on the supplement and has altered her diet.  However she does not note improvement in memory.    PAST MEDICAL HISTORY: Past Medical History:  Diagnosis Date   Alcohol abuse    Anemia    Depression    Migraines    Pre-diabetes     MEDICATIONS: Current Outpatient Medications on File Prior to Visit  Medication Sig Dispense Refill   ELESTRIN  0.52 MG/0.87 GM (0.06%) GEL Apply 1 Application topically daily.     Erenumab -aooe ([name] ) 140 MG/ML SOAJ INJECT 140 MG UNDER THE SKIN EVERY 30 DAYS 3 mL 0   Ferrous Sulfate (IRON PO) Take 65 mg by mouth 2 (two) times a week.     flurbiprofen  (ANSAID ) 100 MG tablet Take 1 tablet (100 mg total) by mouth every 8 (eight) hours as needed (Maximum 3 tablets in 24 hours). 72 tablet 3   Multiple Vitamin (MULTIVITAMIN PO) Take 2 tablets by mouth daily.     OVER THE COUNTER MEDICATION Supplement for bone density-once a day     tirzepatide  (ZEPBOUND ) 15 MG/0.5ML Pen Inject 15 mg into the skin once a week. 6 mL 1   No current facility-administered medications on file prior to visit.    ALLERGIES: Allergies  Allergen Reactions   Vantin [Cefpodoxime] Other (See Comments)    migraine    FAMILY HISTORY: Family History  Problem Relation Age of Onset   Melanoma Mother    Liver disease Father    Depression Father    High blood pressure Father    High Cholesterol Father    Alcohol abuse Father    Stroke Brother 15   Cancer Brother        skin   Skin cancer Maternal Grandmother    Leukemia Maternal Grandfather     Arthritis Paternal Grandmother    Hearing loss Paternal Grandmother    Hearing loss Paternal Grandfather    Colon cancer Neg Hx    Colon polyps Neg Hx    Esophageal cancer Neg Hx    Rectal cancer Neg Hx    Stomach cancer Neg Hx       Objective:  Blood pressure 125/85, pulse 74, height 5' 4 (1.626 m), weight 120 lb 9.6 oz (54.7 kg), SpO2 96%. General: No acute distress.  Patient appears well-groomed.   Head:  Normocephalic/atraumatic Neck:  Supple.  No paraspinal tenderness.  Full range of motion. Heart:  Regular rate and rhythm. Neuro:  Alert and oriented.  Speech fluent and not dysarthric.  Language intact.  CN II-XII intact.  Bulk and tone normal.  Muscle strength 5/5 throughout.  Sensation to light touch intact.  Deep tendon reflexes 2+ throughout, toes downgoing.  Gait normal.  Romberg negative.    Juliene Dunnings, DO  CC:  Heron Sharper, MD          "

## 2024-03-15 ENCOUNTER — Ambulatory Visit (INDEPENDENT_AMBULATORY_CARE_PROVIDER_SITE_OTHER): Payer: BC Managed Care – PPO | Admitting: Neurology

## 2024-03-15 ENCOUNTER — Encounter: Payer: Self-pay | Admitting: Neurology

## 2024-03-15 DIAGNOSIS — G43009 Migraine without aura, not intractable, without status migrainosus: Secondary | ICD-10-CM

## 2024-03-15 MED ORDER — AIMOVIG 140 MG/ML ~~LOC~~ SOAJ: SUBCUTANEOUS | 2 refills | Status: AC

## 2024-03-15 MED ORDER — FLURBIPROFEN 100 MG PO TABS: 100.0000 mg | ORAL_TABLET | Freq: Three times a day (TID) | ORAL | 3 refills | Status: AC | PRN

## 2024-03-25 ENCOUNTER — Encounter: Payer: Self-pay | Admitting: Family Medicine

## 2024-03-25 DIAGNOSIS — E669 Obesity, unspecified: Secondary | ICD-10-CM

## 2024-04-05 MED ORDER — PHENTERMINE HCL 30 MG PO CAPS: 30.0000 mg | ORAL_CAPSULE | ORAL | 0 refills | Status: AC

## 2024-09-07 ENCOUNTER — Encounter: Admitting: Family Medicine

## 2025-03-15 ENCOUNTER — Ambulatory Visit: Payer: Self-pay | Admitting: Neurology
